# Patient Record
Sex: Female | Born: 1975 | Race: White | Hispanic: No | Marital: Married | Smoking: Former smoker
Health system: Southern US, Community
[De-identification: ages and names within clinical notes are randomized; demographics above are authoritative.]

## PROBLEM LIST (undated history)

## (undated) DIAGNOSIS — F32A Depression, unspecified: Secondary | ICD-10-CM

## (undated) DIAGNOSIS — T7840XA Allergy, unspecified, initial encounter: Secondary | ICD-10-CM

## (undated) DIAGNOSIS — F419 Anxiety disorder, unspecified: Secondary | ICD-10-CM

## (undated) DIAGNOSIS — K219 Gastro-esophageal reflux disease without esophagitis: Secondary | ICD-10-CM

## (undated) DIAGNOSIS — T8859XA Other complications of anesthesia, initial encounter: Secondary | ICD-10-CM

## (undated) DIAGNOSIS — F329 Major depressive disorder, single episode, unspecified: Secondary | ICD-10-CM

## (undated) DIAGNOSIS — R112 Nausea with vomiting, unspecified: Secondary | ICD-10-CM

## (undated) DIAGNOSIS — R87629 Unspecified abnormal cytological findings in specimens from vagina: Secondary | ICD-10-CM

## (undated) DIAGNOSIS — Z9889 Other specified postprocedural states: Secondary | ICD-10-CM

## (undated) DIAGNOSIS — T4145XA Adverse effect of unspecified anesthetic, initial encounter: Secondary | ICD-10-CM

## (undated) HISTORY — PX: TUBAL LIGATION: SHX77

## (undated) HISTORY — DX: Unspecified abnormal cytological findings in specimens from vagina: R87.629

## (undated) HISTORY — DX: Depression, unspecified: F32.A

## (undated) HISTORY — DX: Allergy, unspecified, initial encounter: T78.40XA

## (undated) HISTORY — PX: AXILLARY HIDRADENITIS EXCISION: SUR522

## (undated) HISTORY — DX: Gastro-esophageal reflux disease without esophagitis: K21.9

## (undated) HISTORY — DX: Anxiety disorder, unspecified: F41.9

## (undated) HISTORY — PX: APPENDECTOMY: SHX54

---
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
## 2015-04-14 ENCOUNTER — Encounter: Payer: Self-pay | Admitting: Emergency Medicine

## 2015-04-14 ENCOUNTER — Emergency Department
Admission: EM | Admit: 2015-04-14 | Discharge: 2015-04-14 | Disposition: A | Discharge status: Home / Self Care | Payer: Medicaid Other | Attending: Emergency Medicine | Admitting: Emergency Medicine

## 2015-04-14 DIAGNOSIS — Z3202 Encounter for pregnancy test, result negative: Secondary | ICD-10-CM | POA: Diagnosis not present

## 2015-04-14 DIAGNOSIS — Z87891 Personal history of nicotine dependence: Secondary | ICD-10-CM | POA: Diagnosis not present

## 2015-04-14 DIAGNOSIS — R112 Nausea with vomiting, unspecified: Secondary | ICD-10-CM | POA: Diagnosis present

## 2015-04-14 DIAGNOSIS — R197 Diarrhea, unspecified: Secondary | ICD-10-CM | POA: Insufficient documentation

## 2015-04-14 DIAGNOSIS — K625 Hemorrhage of anus and rectum: Secondary | ICD-10-CM

## 2015-04-14 LAB — COMPREHENSIVE METABOLIC PANEL
ALK PHOS: 52 U/L (ref 38–126)
ALT: 15 U/L (ref 14–54)
AST: 20 U/L (ref 15–41)
Albumin: 4.8 g/dL (ref 3.5–5.0)
Anion gap: 8 (ref 5–15)
BUN: 12 mg/dL (ref 6–20)
CHLORIDE: 106 mmol/L (ref 101–111)
CO2: 24 mmol/L (ref 22–32)
CREATININE: 0.53 mg/dL (ref 0.44–1.00)
Calcium: 9.4 mg/dL (ref 8.9–10.3)
GFR calc Af Amer: >60 mL/min (ref >60)
Glucose, Bld: 107 mg/dL — ABNORMAL HIGH (ref 65–99)
Potassium: 3.7 mmol/L (ref 3.5–5.1)
Sodium: 138 mmol/L (ref 135–145)
Total Bilirubin: 0.6 mg/dL (ref 0.3–1.2)
Total Protein: 8.2 g/dL — ABNORMAL HIGH (ref 6.5–8.1)

## 2015-04-14 LAB — CBC
HCT: 41.7 % (ref 35.0–47.0)
Hemoglobin: 13.9 g/dL (ref 12.0–16.0)
MCH: 31.7 pg (ref 26.0–34.0)
MCHC: 33.4 g/dL (ref 32.0–36.0)
MCV: 94.9 fL (ref 80.0–100.0)
PLATELETS: 272 10*3/uL (ref 150–440)
RBC: 4.39 MIL/uL (ref 3.80–5.20)
RDW: 12.8 % (ref 11.5–14.5)
WBC: 12.2 10*3/uL — AB (ref 3.6–11.0)

## 2015-04-14 LAB — URINALYSIS COMPLETE WITH MICROSCOPIC (ARMC ONLY)
BILIRUBIN URINE: NEGATIVE
Bacteria, UA: NONE SEEN
GLUCOSE, UA: NEGATIVE mg/dL
Hgb urine dipstick: NEGATIVE
Ketones, ur: NEGATIVE mg/dL
LEUKOCYTES UA: NEGATIVE
Nitrite: NEGATIVE
Protein, ur: 30 mg/dL — AB
Specific Gravity, Urine: 1.03 (ref 1.005–1.030)
pH: 6 (ref 5.0–8.0)

## 2015-04-14 LAB — LACTIC ACID, PLASMA: LACTIC ACID, VENOUS: 0.7 mmol/L (ref 0.5–2.0)

## 2015-04-14 LAB — POCT PREGNANCY, URINE: Preg Test, Ur: NEGATIVE

## 2015-04-14 LAB — LIPASE, BLOOD: LIPASE: 21 U/L (ref 11–51)

## 2015-04-14 MED ORDER — ONDANSETRON HCL 4 MG PO TABS: 4.0000 mg | ORAL_TABLET | Freq: Three times a day (TID) | ORAL | Status: DC | PRN

## 2015-04-14 NOTE — ED Provider Notes (Signed)
[facility] Emergency Department Provider Note   ____________________________________________  Time seen: I have reviewed the triage vital signs and the triage nursing note.  HISTORY  Chief Complaint Abdominal Pain and Rectal Bleeding   Historian Patient and friend  HPI Yolanda Curtis is a 40 y.o. female who is here for evaluation of multiple episodes of vomiting and diarrhea as well as bloody per rectum.Patient had [place] food last night and out of the many people who had dinner there, she is feeling when he got sick. No fever. She started feeling bad within about an hour and had numerous episodes of diarrhea as well as nausea with some vomiting. Overnight she had decrease in the amount of actual stool output, but about every one hour she had blood per rectum. No rectal pain. Mild abdominal cramping. No bloody emesis.    History reviewed. No pertinent past medical history. reports that 17 years ago she had an episode of bloody stool and had a CAT scan that showed "ischemic colitis"  She reports being treated with antibiotics, and then colonoscopies but no source of the ischemia was ever found. She does not report any abdominal pain at that point in time.  There are no active problems to display for this patient.   Past Surgical History  Procedure Laterality Date  . Cesarean section  x 2    No current outpatient prescriptions on file.  Allergies Review of patient's allergies indicates no known allergies.  No family history on file.  Social History Social History  Substance Use Topics  . Smoking status: Former Games developermoker  . Smokeless tobacco: Never Used  . Alcohol Use: 0.6 oz/week    1 Glasses of wine per week    Review of Systems  Constitutional: Negative for fever. Eyes: Negative for visual changes. ENT: Negative for sore throat. Cardiovascular: Negative for chest pain. Respiratory: Negative for shortness of breath. Gastrointestinal: As  per history of present illness Genitourinary: Negative for dysuria. Musculoskeletal: Negative for back pain. Skin: Negative for rash. Neurological: Negative for headache. 10 point Review of Systems otherwise negative ____________________________________________   PHYSICAL EXAM:  VITAL SIGNS: ED Triage Vitals  Enc Vitals Group     BP 04/14/15 0949 106/49 mmHg     Pulse Rate 04/14/15 0949 92     Resp 04/14/15 0949 16     Temp 04/14/15 0949 98.1 F (36.7 C)     Temp Source 04/14/15 0949 Oral     SpO2 04/14/15 0949 100 %     Weight 04/14/15 0949 130 lb (58.968 kg)     Height 04/14/15 0949 5\' 2"  (1.575 m)     Head Cir --      Peak Flow --      Pain Score 04/14/15 0954 2     Pain Loc --      Pain Edu? --      Excl. in GC? --      Constitutional: Alert and oriented. Well appearing and in no distress. Eyes: Conjunctivae are normal. PERRL. Normal extraocular movements. ENT   Head: Normocephalic and atraumatic.   Nose: No congestion/rhinnorhea.   Mouth/Throat: Mucous membranes are moist.   Neck: No stridor. Cardiovascular/Chest: Normal rate, regular rhythm.  No murmurs, rubs, or gallops. Respiratory: Normal respiratory effort without tachypnea nor retractions. Breath sounds are clear and equal bilaterally. No wheezes/rales/rhonchi. Gastrointestinal: Soft. No distention, no guarding, no rebound. Mild tenderness.  Genitourinary/rectal: External rectal exam normal. Nontender rectal exam. No blood on digital rectal exam. No  stool on digital rectal exam. Musculoskeletal: Nontender with normal range of motion in all extremities. No joint effusions.  No lower extremity tenderness.  No edema. Neurologic:  Normal speech and language. No gross or focal neurologic deficits are appreciated. Skin:  Skin is warm, dry and intact. No rash noted. Psychiatric: Mood and affect are normal. Speech and behavior are normal. Patient exhibits appropriate insight and  judgment.  ____________________________________________   EKG I, Governor Rooks, MD, the attending physician have personally viewed and interpreted all ECGs.  None ____________________________________________  LABS (pertinent positives/negatives)  Urine pregnancy test negative Lipase 21 Comprehensive metabolic panel without significant abnormality White blood count 12.2, hemoglobin 13.9 and platelet count 272 Urinalysis negative for ketones and otherwise negative Lactate 0.7  ____________________________________________  RADIOLOGY All Xrays were viewed by me. Imaging interpreted by Radiologist.  None __________________________________________  PROCEDURES  Procedure(s) performed: None  Critical Care performed: None  ____________________________________________   ED COURSE / ASSESSMENT AND PLAN  CONSULTATIONS: None  Pertinent labs & imaging results that were available during my care of the patient were reviewed by me and considered in my medical decision making (see chart for details).   It seems like her symptoms may have been related to that food yesterday. She reports that her symptoms are mostly resolved at this point in time. We discussed that her exam, vital signs, and laboratory evaluation are reassuring. Her white blood count is minimally elevated, but her hemoglobin is reassuring at 13.9.  Given that she is not having any additional diarrhea or bloody bowel movements, I think it is okay for her to eat discharged. We discussed that I would recommend stool culture if she has additional or recurrent/persistent bloody diarrhea.  When she was about to be discharged, she given a history that when she was younger she had an episode that was diagnosed as ischemic colitis. This seems quite unusual especially given her age, however she seems to remember quite clearly that was called "ischemic" and that no certain cause was found for that.  With her abdomen soft and  nontender, I'm not inclined to do a CT scan today. I discussed with her that I will add on a lactate in terms of the way to evaluate for ischemic colitis.  Lactate normal.  ----------------------------------------- 3:22 PM on 04/14/2015 -----------------------------------------  No additional blood per rectum or bowel movements here in the ED. Patient feels well or just some very very mild cramping or queasy feeling. She is trying to drive home to [place] either tonight or tomorrow. I will discharge her with a couple of Zofran.  Patient / Family / Caregiver informed of clinical course, medical decision-making process, and agree with plan.   I discussed return precautions, follow-up instructions, and discharged instructions with patient and/or family.  ___________________________________________   FINAL CLINICAL IMPRESSION(S) / ED DIAGNOSES   Final diagnoses:  Nausea vomiting and diarrhea  Blood per rectum              Note: This dictation was prepared with Dragon dictation. Any transcriptional errors that result from this process are unintentional   Governor Rooks, MD 04/14/15 1523

## 2015-04-14 NOTE — ED Notes (Signed)
Pt states she went out to eat yesterday then 30 mins later multiple episodes diarrhea and 2 episodes of vomiting. She reports since about 9pm she had no stool but mucus-like blood from her stool. Also lower abd cramping denies fever.

## 2015-04-14 NOTE — Discharge Instructions (Signed)
You were evaluated for nausea vomiting and diarrhea with blood per rectum, and your exam and evaluation are reassuring today in the emergency department.  Return to the emergency department for any worsening condition including worsening abdominal pain, fever, concern for dehydration, additional bleeding with dizziness, passing out, or any other symptoms concerning to you.  We discussed if you had fever or certainly continued diarrhea that was bloody that I would recommend a stool culture.   Bloody Diarrhea Bloody diarrhea can be caused by many different conditions. Most of the time bloody diarrhea is the result of food poisoning or minor infections. Bloody diarrhea usually improves over 2 to 3 days of rest and fluid replacement. Other conditions that can cause bloody diarrhea include:  Internal bleeding.  Infection.  Diseases of the bowel and colon. Internal bleeding from an ulcer or bowel disease can be severe and requires hospital care or even surgery. DIAGNOSIS  To find out what is wrong your caregiver may check your:  Stool.  Blood.  Results from a test that looks inside the body (endoscopy). TREATMENT   Get plenty of rest.  Drink enough water and fluids to keep your urine clear or pale yellow.  Do not smoke.  Solid foods and dairy products should be avoided until your illness improves.  As you improve, slowly return to a regular diet with easily-digested foods first. Examples are:  Bananas.  Rice.  Toast.  Crackers. You should only need these for about 2 days before adding more normal foods to your diet.  Avoid spicy or fatty foods as well as caffeine and alcohol for several days.  Medicine to control cramping and diarrhea can relieve symptoms but may prolong some cases of bloody diarrhea. Antibiotics can speed recovery from diarrhea due to some bacterial infections. Call your caregiver if diarrhea does not get better in 3 days. SEEK MEDICAL CARE IF:   You do  not improve after 3 days.  Your diarrhea improves but your stool appears black. SEEK IMMEDIATE MEDICAL CARE IF:   You become extremely weak or faint.  You become very sweaty.  You have increased pain or bleeding.  You develop repeated vomiting.  You vomit and you see blood or the vomit looks black in color.  You have a fever.   This information is not intended to replace advice given to you by your health care provider. Make sure you discuss any questions you have with your health care provider.   Document Released: 03/16/2005 Document Revised: 04/06/2014 Document Reviewed: 02/15/2009 Elsevier Interactive Patient Education [employer]2016 Elsevier Inc.

## 2018-08-23 ENCOUNTER — Other Ambulatory Visit: Payer: Self-pay

## 2018-08-23 ENCOUNTER — Encounter: Payer: Self-pay | Admitting: Radiology

## 2018-08-23 ENCOUNTER — Encounter: Payer: Self-pay | Admitting: Family Medicine

## 2018-08-23 ENCOUNTER — Ambulatory Visit (INDEPENDENT_AMBULATORY_CARE_PROVIDER_SITE_OTHER): Payer: 59 | Admitting: Family Medicine

## 2018-08-23 VITALS — BP 133/72 | HR 87 | Ht 62.0 in | Wt 149.0 lb

## 2018-08-23 DIAGNOSIS — F3281 Premenstrual dysphoric disorder: Secondary | ICD-10-CM

## 2018-08-23 DIAGNOSIS — N390 Urinary tract infection, site not specified: Secondary | ICD-10-CM

## 2018-08-23 DIAGNOSIS — B9689 Other specified bacterial agents as the cause of diseases classified elsewhere: Secondary | ICD-10-CM | POA: Insufficient documentation

## 2018-08-23 DIAGNOSIS — N760 Acute vaginitis: Secondary | ICD-10-CM

## 2018-08-23 DIAGNOSIS — Z124 Encounter for screening for malignant neoplasm of cervix: Secondary | ICD-10-CM

## 2018-08-23 DIAGNOSIS — A609 Anogenital herpesviral infection, unspecified: Secondary | ICD-10-CM

## 2018-08-23 DIAGNOSIS — Z01411 Encounter for gynecological examination (general) (routine) with abnormal findings: Secondary | ICD-10-CM | POA: Diagnosis not present

## 2018-08-23 DIAGNOSIS — Z1151 Encounter for screening for human papillomavirus (HPV): Secondary | ICD-10-CM | POA: Diagnosis not present

## 2018-08-23 MED ORDER — NORETHIN-ETH ESTRAD-FE BIPHAS 1 MG-10 MCG / 10 MCG PO TABS: 1.0000 | ORAL_TABLET | Freq: Every day | ORAL | 3 refills | Status: DC

## 2018-08-23 NOTE — Assessment & Plan Note (Signed)
Trial of low dose OCP--has been on and tolerated in the past, since SSRI did not help in the past.

## 2018-08-23 NOTE — Progress Notes (Signed)
  Subjective:     Yolanda Curtis is a 43 y.o. female and is here for a comprehensive physical exam. The patient reports problems - some hormonal issues.. Reports frequent UTIs and recurrent BV. Notes a lot of symptoms related to her cycles. She notes, poor mood, breast tenderness, weight gain, cycles are monthly, not too heavy. Has been on Serafem, but it wasn't helping. She also has cyclical low back pain.  The following portions of the patient's history were reviewed and updated as appropriate: allergies, current medications, past family history, past medical history, past social history, past surgical history and problem list.  Review of Systems Pertinent items noted in HPI and remainder of comprehensive ROS otherwise negative.   Objective:    BP 133/72   Pulse 87   Ht 5\' 2"  (1.575 m)   Wt 149 lb (67.6 kg)   LMP 08/07/2018 (Exact Date)   BMI 27.25 kg/m  General appearance: alert, cooperative and appears stated age Head: Normocephalic, without obvious abnormality, atraumatic Neck: no adenopathy, supple, symmetrical, trachea midline and thyroid not enlarged, symmetric, no tenderness/mass/nodules Lungs: clear to auscultation bilaterally Breasts: normal appearance, no masses or tenderness Heart: regular rate and rhythm, S1, S2 normal, no murmur, click, rub or gallop Abdomen: soft, non-tender; bowel sounds normal; no masses,  no organomegaly Pelvic: cervix normal in appearance, external genitalia normal, no adnexal masses or tenderness, no cervical motion tenderness, uterus normal size, shape, and consistency and vagina normal without discharge Extremities: extremities normal, atraumatic, no cyanosis or edema Pulses: 2+ and symmetric Skin: Skin color, texture, turgor normal. No rashes or lesions Lymph nodes: Cervical, supraclavicular, and axillary nodes normal.    Assessment:    Healthy female exam.      Plan:   Problem List Items Addressed This Visit      Unprioritized   Frequent UTI   Relevant Medications   nitrofurantoin, macrocrystal-monohydrate, (MACROBID) 100 MG capsule   Bacterial vaginosis   HSV (herpes simplex virus) anogenital infection   PMDD (premenstrual dysphoric disorder) - Primary    Trial of low dose OCP--has been on and tolerated in the past, since SSRI did not help in the past.      Relevant Medications   Norethindrone-Ethinyl Estradiol-Fe Biphas (LO LOESTRIN FE) 1 MG-10 MCG / 10 MCG tablet    Other Visit Diagnoses    Screening for malignant neoplasm of cervix       Relevant Orders   Cytology - PAP( [facility])   Encounter for gynecological examination with abnormal finding       Relevant Orders   MM DIGITAL SCREENING BILATERAL   CBC   Comprehensive metabolic panel   Hemoglobin A1c   Lipid panel   TSH   Vitamin D (25 hydroxy)     Return in 1 year (on 08/23/2019).    See After Visit Summary for Counseling Recommendations

## 2018-08-23 NOTE — Patient Instructions (Signed)
 Preventive Care 18-39 Years, Female Preventive care refers to lifestyle choices and visits with your health care provider that can promote health and wellness. What does preventive care include?   A yearly physical exam. This is also called an annual well check.  Dental exams once or twice a year.  Routine eye exams. Ask your health care provider how often you should have your eyes checked.  Personal lifestyle choices, including: ? Daily care of your teeth and gums. ? Regular physical activity. ? Eating a healthy diet. ? Avoiding tobacco and drug use. ? Limiting alcohol use. ? Practicing safe sex. ? Taking vitamin and mineral supplements as recommended by your health care provider. What happens during an annual well check? The services and screenings done by your health care provider during your annual well check will depend on your age, overall health, lifestyle risk factors, and family history of disease. Counseling Your health care provider may ask you questions about your:  Alcohol use.  Tobacco use.  Drug use.  Emotional well-being.  Home and relationship well-being.  Sexual activity.  Eating habits.  Work and work environment.  Method of birth control.  Menstrual cycle.  Pregnancy history. Screening You may have the following tests or measurements:  Height, weight, and BMI.  Diabetes screening. This is done by checking your blood sugar (glucose) after you have not eaten for a while (fasting).  Blood pressure.  Lipid and cholesterol levels. These may be checked every 5 years starting at age 20.  Skin check.  Hepatitis C blood test.  Hepatitis B blood test.  Sexually transmitted disease (STD) testing.  BRCA-related cancer screening. This may be done if you have a family history of breast, ovarian, tubal, or peritoneal cancers.  Pelvic exam and Pap test. This may be done every 3 years starting at age 21. Starting at age 30, this may be done  every 5 years if you have a Pap test in combination with an HPV test. Discuss your test results, treatment options, and if necessary, the need for more tests with your health care provider. Vaccines Your health care provider may recommend certain vaccines, such as:  Influenza vaccine. This is recommended every year.  Tetanus, diphtheria, and acellular pertussis (Tdap, Td) vaccine. You may need a Td booster every 10 years.  Varicella vaccine. You may need this if you have not been vaccinated.  HPV vaccine. If you are 26 or younger, you may need three doses over 6 months.  Measles, mumps, and rubella (MMR) vaccine. You may need at least one dose of MMR. You may also need a second dose.  Pneumococcal 13-valent conjugate (PCV13) vaccine. You may need this if you have certain conditions and were not previously vaccinated.  Pneumococcal polysaccharide (PPSV23) vaccine. You may need one or two doses if you smoke cigarettes or if you have certain conditions.  Meningococcal vaccine. One dose is recommended if you are age 19-21 years and a first-year college student living in a residence hall, or if you have one of several medical conditions. You may also need additional booster doses.  Hepatitis A vaccine. You may need this if you have certain conditions or if you travel or work in places where you may be exposed to hepatitis A.  Hepatitis B vaccine. You may need this if you have certain conditions or if you travel or work in places where you may be exposed to hepatitis B.  Haemophilus influenzae type b (Hib) vaccine. You may need this if   you have certain risk factors. Talk to your health care provider about which screenings and vaccines you need and how often you need them. This information is not intended to replace advice given to you by your health care provider. Make sure you discuss any questions you have with your health care provider. Document Released: 05/12/2001 Document Revised:  10/27/2016 Document Reviewed: 01/15/2015 Elsevier Interactive Patient Education  2019 Elsevier Inc.  

## 2018-08-23 NOTE — Progress Notes (Signed)
Had abnormal pap in 11/2017, was told she needed a repeat pap in 6 months. Moved from [place] in March.  Would like discuss symptoms she has around her cycle.     Mammo was done 11/2017-WNL

## 2018-08-25 LAB — CYTOLOGY - PAP
Diagnosis: NEGATIVE
HPV: NOT DETECTED

## 2018-11-03 ENCOUNTER — Telehealth: Payer: Self-pay

## 2018-11-03 NOTE — Telephone Encounter (Signed)
Patient was started on Northindrone the lowest dose. She is almost 2-3 month since she starting taking these pills. She reports her symptoms are little better. She is having some breakthrough bleeding right before her cycle. She would like to know if you want to increase the dose or should she schedule a follow up appointment to discuss?

## 2018-11-03 NOTE — Telephone Encounter (Signed)
Patient was started on lo

## 2018-11-08 ENCOUNTER — Telehealth (INDEPENDENT_AMBULATORY_CARE_PROVIDER_SITE_OTHER): Payer: 59 | Admitting: Family Medicine

## 2018-11-08 ENCOUNTER — Other Ambulatory Visit: Payer: Self-pay

## 2018-11-08 ENCOUNTER — Telehealth: Payer: Self-pay | Admitting: Radiology

## 2018-11-08 ENCOUNTER — Encounter: Payer: Self-pay | Admitting: Family Medicine

## 2018-11-08 DIAGNOSIS — F3281 Premenstrual dysphoric disorder: Secondary | ICD-10-CM | POA: Diagnosis not present

## 2018-11-08 MED ORDER — NORETHIN ACE-ETH ESTRAD-FE 1-20 MG-MCG(24) PO TABS: 1.0000 | ORAL_TABLET | Freq: Every day | ORAL | 11 refills | Status: DC

## 2018-11-08 NOTE — Progress Notes (Signed)
    TELEHEALTH GYNECOLOGY VIRTUAL VIDEO VISIT ENCOUNTER NOTE  Provider location: [facility] at [facility]   I connected with Yolanda Curtis on 11/08/18 at  2:15 PM EDT by MyChart Video Encounter at home and verified that I am speaking with the correct person using two identifiers.   I discussed the limitations, risks, security and privacy concerns of performing an evaluation and management service virtually and the availability of in person appointments. I also discussed with the patient that there may be a patient responsible charge related to this service. The patient expressed understanding and agreed to proceed.   History:  Yolanda Curtis is a 43 y.o. G17P2002 female being evaluated today for f/u OC start. Placed on Lo loestrin but still having her low back pain and mood is somewhat improved but not completely better. Also with some breakthrough bleeding at end of pack. She had been reluctant to get on estrogen at her age and so we started with a super low dose pill. She desires to increase the dosage to see if we can manage her symptoms better.She denies any abnormal vaginal discharge, pelvic pain or other concerns.       Past Medical History:  Diagnosis Date  . Vaginal Pap smear, abnormal    Past Surgical History:  Procedure Laterality Date  . AXILLARY HIDRADENITIS EXCISION    . CESAREAN SECTION  x 2  . TUBAL LIGATION     The following portions of the patient's history were reviewed and updated as appropriate: allergies, current medications, past family history, past medical history, past social history, past surgical history and problem list.   Health Maintenance:  Normal pap and negative HRHPV on 07/2018.   Review of Systems:  Pertinent items noted in HPI and remainder of comprehensive ROS otherwise negative.  Physical Exam:   General:  Alert, oriented and cooperative. Patient appears to be in no acute distress.  Mental Status: Normal mood and affect.  Normal behavior. Normal judgment and thought content.   Respiratory: Normal respiratory effort, no problems with respiration noted  Rest of physical exam deferred due to type of encounter  Labs and Imaging No results found for this or any previous visit (from the past 336 hour(s)). No results found.     Assessment and Plan:     Problem List Items Addressed This Visit      Unprioritized   PMDD (premenstrual dysphoric disorder) - Primary    Switch to Loestrin x 3 months. She is invited to message me if not seeing th improvement she desires or if she is having more breakthrough bleeding.      Relevant Medications   Norethindrone Acetate-Ethinyl Estrad-FE (LOESTRIN 24 FE) 1-20 MG-MCG(24) tablet     I discussed the assessment and treatment plan with the patient. The patient was provided an opportunity to ask questions and all were answered. The patient agreed with the plan and demonstrated an understanding of the instructions.   The patient was advised to call back or seek an in-person evaluation/go to the ED if the symptoms worsen or if the condition fails to improve as anticipated.  I provided 15 minutes of face-to-face time during this encounter.  Return in about 3 months (around 02/08/2019) for virtual, a follow-up.   Donnamae Jude, MD [facility], [place]

## 2018-11-08 NOTE — Assessment & Plan Note (Signed)
Switch to Loestrin x 3 months. She is invited to message me if not seeing th improvement she desires or if she is having more breakthrough bleeding.

## 2018-11-08 NOTE — Progress Notes (Signed)
Last cycle 10/24/2018 Last pap 08/23/2018- normal

## 2018-11-08 NOTE — Telephone Encounter (Signed)
Left message for patient to call [id number] to schedule virtual visit with Dr Kennon Rounds for [facility] concern

## 2018-11-14 ENCOUNTER — Telehealth: Payer: 59 | Admitting: Family Medicine

## 2019-02-08 ENCOUNTER — Ambulatory Visit
Admission: RE | Admit: 2019-02-08 | Discharge: 2019-02-08 | Disposition: A | Discharge status: Home / Self Care | Payer: 59 | Source: Ambulatory Visit | Attending: Family Medicine | Admitting: Family Medicine

## 2019-02-08 DIAGNOSIS — Z1231 Encounter for screening mammogram for malignant neoplasm of breast: Secondary | ICD-10-CM | POA: Insufficient documentation

## 2019-02-08 DIAGNOSIS — Z01411 Encounter for gynecological examination (general) (routine) with abnormal findings: Secondary | ICD-10-CM

## 2019-02-28 ENCOUNTER — Other Ambulatory Visit: Payer: Self-pay | Admitting: Family Medicine

## 2019-02-28 DIAGNOSIS — R928 Other abnormal and inconclusive findings on diagnostic imaging of breast: Secondary | ICD-10-CM

## 2019-02-28 DIAGNOSIS — R921 Mammographic calcification found on diagnostic imaging of breast: Secondary | ICD-10-CM

## 2019-03-06 ENCOUNTER — Ambulatory Visit
Admission: RE | Admit: 2019-03-06 | Discharge: 2019-03-06 | Disposition: A | Discharge status: Home / Self Care | Payer: 59 | Source: Ambulatory Visit | Attending: Family Medicine | Admitting: Family Medicine

## 2019-03-06 DIAGNOSIS — R921 Mammographic calcification found on diagnostic imaging of breast: Secondary | ICD-10-CM | POA: Diagnosis present

## 2019-03-06 DIAGNOSIS — R928 Other abnormal and inconclusive findings on diagnostic imaging of breast: Secondary | ICD-10-CM | POA: Insufficient documentation

## 2019-04-21 ENCOUNTER — Encounter: Payer: Self-pay | Admitting: Adult Health

## 2019-04-21 ENCOUNTER — Other Ambulatory Visit: Payer: Self-pay

## 2019-04-21 ENCOUNTER — Ambulatory Visit (INDEPENDENT_AMBULATORY_CARE_PROVIDER_SITE_OTHER): Payer: 59 | Admitting: Adult Health

## 2019-04-21 VITALS — BP 122/84 | HR 88 | Temp 97.0°F | Resp 16 | Ht 61.5 in | Wt 149.0 lb

## 2019-04-21 DIAGNOSIS — Z1389 Encounter for screening for other disorder: Secondary | ICD-10-CM

## 2019-04-21 DIAGNOSIS — K2950 Unspecified chronic gastritis without bleeding: Secondary | ICD-10-CM

## 2019-04-21 DIAGNOSIS — Z1329 Encounter for screening for other suspected endocrine disorder: Secondary | ICD-10-CM

## 2019-04-21 DIAGNOSIS — R5383 Other fatigue: Secondary | ICD-10-CM | POA: Diagnosis not present

## 2019-04-21 DIAGNOSIS — Z13220 Encounter for screening for lipoid disorders: Secondary | ICD-10-CM

## 2019-04-21 DIAGNOSIS — R632 Polyphagia: Secondary | ICD-10-CM

## 2019-04-21 DIAGNOSIS — F411 Generalized anxiety disorder: Secondary | ICD-10-CM

## 2019-04-21 DIAGNOSIS — E559 Vitamin D deficiency, unspecified: Secondary | ICD-10-CM

## 2019-04-21 DIAGNOSIS — Z8719 Personal history of other diseases of the digestive system: Secondary | ICD-10-CM

## 2019-04-21 DIAGNOSIS — F32A Depression, unspecified: Secondary | ICD-10-CM

## 2019-04-21 DIAGNOSIS — F329 Major depressive disorder, single episode, unspecified: Secondary | ICD-10-CM

## 2019-04-21 DIAGNOSIS — F4010 Social phobia, unspecified: Secondary | ICD-10-CM

## 2019-04-21 DIAGNOSIS — Z0000 Encounter for general adult medical examination without abnormal findings: Secondary | ICD-10-CM

## 2019-04-21 HISTORY — DX: Unspecified chronic gastritis without bleeding: K29.50

## 2019-04-21 LAB — POCT URINALYSIS DIPSTICK
Bilirubin, UA: NEGATIVE
Blood, UA: NEGATIVE
Glucose, UA: NEGATIVE
Ketones, UA: NEGATIVE
Leukocytes, UA: NEGATIVE
Nitrite, UA: NEGATIVE
Protein, UA: NEGATIVE
Spec Grav, UA: 1.015 (ref 1.010–1.025)
Urobilinogen, UA: 0.2 E.U./dL
pH, UA: 6 (ref 5.0–8.0)

## 2019-04-21 MED ORDER — VENLAFAXINE HCL ER 37.5 MG PO CP24: 37.5000 mg | ORAL_CAPSULE | Freq: Every day | ORAL | 0 refills | Status: DC

## 2019-04-21 NOTE — Patient Instructions (Addendum)
Health Maintenance, Female Adopting a healthy lifestyle and getting preventive care are important in promoting health and wellness. Ask your health care provider about:  The right schedule for you to have regular tests and exams.  Things you can do on your own to prevent diseases and keep yourself healthy. What should I know about diet, weight, and exercise? Eat a healthy diet   Eat a diet that includes plenty of vegetables, fruits, low-fat dairy products, and lean protein.  Do not eat a lot of foods that are high in solid fats, added sugars, or sodium. Maintain a healthy weight Body mass index (BMI) is used to identify weight problems. It estimates body fat based on height and weight. Your health care provider can help determine your BMI and help you achieve or maintain a healthy weight. Get regular exercise Get regular exercise. This is one of the most important things you can do for your health. Most adults should:  Exercise for at least 150 minutes each week. The exercise should increase your heart rate and make you sweat (moderate-intensity exercise).  Do strengthening exercises at least twice a week. This is in addition to the moderate-intensity exercise.  Spend less time sitting. Even light physical activity can be beneficial. Watch cholesterol and blood lipids Have your blood tested for lipids and cholesterol at 42 years of age, then have this test every 5 years. Have your cholesterol levels checked more often if:  Your lipid or cholesterol levels are high.  You are older than 42 years of age.  You are at high risk for heart disease. What should I know about cancer screening? Depending on your health history and family history, you may need to have cancer screening at various ages. This may include screening for:  Breast cancer.  Cervical cancer.  Colorectal cancer.  Skin cancer.  Lung cancer. What should I know about heart disease, diabetes, and high blood  pressure? Blood pressure and heart disease  High blood pressure causes heart disease and increases the risk of stroke. This is more likely to develop in people who have high blood pressure readings, are of African descent, or are overweight.  Have your blood pressure checked: ? Every 3-5 years if you are 18-39 years of age. ? Every year if you are 40 years old or older. Diabetes Have regular diabetes screenings. This checks your fasting blood sugar level. Have the screening done:  Once every three years after age 40 if you are at a normal weight and have a low risk for diabetes.  More often and at a younger age if you are overweight or have a high risk for diabetes. What should I know about preventing infection? Hepatitis B If you have a higher risk for hepatitis B, you should be screened for this virus. Talk with your health care provider to find out if you are at risk for hepatitis B infection. Hepatitis C Testing is recommended for:  Everyone born from 1945 through 1965.  Anyone with known risk factors for hepatitis C. Sexually transmitted infections (STIs)  Get screened for STIs, including gonorrhea and chlamydia, if: ? You are sexually active and are younger than 42 years of age. ? You are older than 42 years of age and your health care provider tells you that you are at risk for this type of infection. ? Your sexual activity has changed since you were last screened, and you are at increased risk for chlamydia or gonorrhea. Ask your health care provider if   you are at risk.  Ask your health care provider about whether you are at high risk for HIV. Your health care provider may recommend a prescription medicine to help prevent HIV infection. If you choose to take medicine to prevent HIV, you should first get tested for HIV. You should then be tested every 3 months for as long as you are taking the medicine. Pregnancy  If you are about to stop having your period (premenopausal) and  you may become pregnant, seek counseling before you get pregnant.  Take 400 to 800 micrograms (mcg) of folic acid every day if you become pregnant.  Ask for birth control (contraception) if you want to prevent pregnancy. Osteoporosis and menopause Osteoporosis is a disease in which the bones lose minerals and strength with aging. This can result in bone fractures. If you are 80 years old or older, or if you are at risk for osteoporosis and fractures, ask your health care provider if you should:  Be screened for bone loss.  Take a calcium or vitamin D supplement to lower your risk of fractures.  Be given hormone replacement therapy (HRT) to treat symptoms of menopause. Follow these instructions at home: Lifestyle  Do not use any products that contain nicotine or tobacco, such as cigarettes, e-cigarettes, and chewing tobacco. If you need help quitting, ask your health care provider.  Do not use street drugs.  Do not share needles.  Ask your health care provider for help if you need support or information about quitting drugs. Alcohol use  Do not drink alcohol if: ? Your health care provider tells you not to drink. ? You are pregnant, may be pregnant, or are planning to become pregnant.  If you drink alcohol: ? Limit how much you use to 0-1 drink a day. ? Limit intake if you are breastfeeding.  Be aware of how much alcohol is in your drink. In the [place] one drink equals one 12 oz bottle of beer (355 mL), one 5 oz glass of wine (148 mL), or one 1 oz glass of hard liquor (44 mL). General instructions  Schedule regular health, dental, and eye exams.  Stay current with your vaccines.  Tell your health care provider if: ? You often feel depressed. ? You have ever been abused or do not feel safe at home. Summary  Adopting a healthy lifestyle and getting preventive care are important in promoting health and wellness.  Follow your health care provider's instructions about healthy  diet, exercising, and getting tested or screened for diseases.  Follow your health care provider's instructions on monitoring your cholesterol and blood pressure. This information is not intended to replace advice given to you by your health care provider. Make sure you discuss any questions you have with your health care provider. Document Revised: 03/09/2018 Document Reviewed: 03/09/2018 Elsevier Patient Education  [place], Adult After being diagnosed with an anxiety disorder, you may be relieved to know why you have felt or behaved a certain way. You may also feel overwhelmed about the treatment ahead and what it will mean for your life. With care and support, you can manage this condition and recover from it. How to manage lifestyle changes Managing stress and anxiety  Stress is your body's reaction to life changes and events, both good and bad. Most stress will last just a few hours, but stress can be ongoing and can lead to more than just stress. Although stress can play a major role in anxiety,  it is not the same as anxiety. Stress is usually caused by something external, such as a deadline, test, or competition. Stress normally passes after the triggering event has ended.  Anxiety is caused by something internal, such as imagining a terrible outcome or worrying that something will go wrong that will devastate you. Anxiety often does not go away even after the triggering event is over, and it can become long-term (chronic) worry. It is important to understand the differences between stress and anxiety and to manage your stress effectively so that it does not lead to an anxious response. Talk with your health care provider or a counselor to learn more about reducing anxiety and stress. He or she may suggest tension reduction techniques, such as:  Music therapy. This can include creating or listening to music that you enjoy and that inspires you.  Mindfulness-based  meditation. This involves being aware of your normal breaths while not trying to control your breathing. It can be done while sitting or walking.  Centering prayer. This involves focusing on a word, phrase, or sacred image that means something to you and brings you peace.  Deep breathing. To do this, expand your stomach and inhale slowly through your nose. Hold your breath for 3-5 seconds. Then exhale slowly, letting your stomach muscles relax.  Self-talk. This involves identifying thought patterns that lead to anxiety reactions and changing those patterns.  Muscle relaxation. This involves tensing muscles and then relaxing them. Choose a tension reduction technique that suits your lifestyle and personality. These techniques take time and practice. Set aside 5-15 minutes a day to do them. Therapists can offer counseling and training in these techniques. The training to help with anxiety may be covered by some insurance plans. Other things you can do to manage stress and anxiety include:  Keeping a stress/anxiety diary. This can help you learn what triggers your reaction and then learn ways to manage your response.  Thinking about how you react to certain situations. You may not be able to control everything, but you can control your response.  Making time for activities that help you relax and not feeling guilty about spending your time in this way.  Visual imagery and yoga can help you stay calm and relax.  Medicines Medicines can help ease symptoms. Medicines for anxiety include:  Anti-anxiety drugs.  Antidepressants. Medicines are often used as a primary treatment for anxiety disorder. Medicines will be prescribed by a health care provider. When used together, medicines, psychotherapy, and tension reduction techniques may be the most effective treatment. Relationships Relationships can play a big part in helping you recover. Try to spend more time connecting with trusted friends and  family members. Consider going to couples counseling, taking family education classes, or going to family therapy. Therapy can help you and others better understand your condition. How to recognize changes in your anxiety Everyone responds differently to treatment for anxiety. Recovery from anxiety happens when symptoms decrease and stop interfering with your daily activities at home or work. This may mean that you will start to:  Have better concentration and focus. Worry will interfere less in your daily thinking.  Sleep better.  Be less irritable.  Have more energy.  Have improved memory. It is important to recognize when your condition is getting worse. Contact your health care provider if your symptoms interfere with home or work and you feel like your condition is not improving. Follow these instructions at home: Activity  Exercise. Most adults should do  the following: ? Exercise for at least 150 minutes each week. The exercise should increase your heart rate and make you sweat (moderate-intensity exercise). ? Strengthening exercises at least twice a week.  Get the right amount and quality of sleep. Most adults need 7-9 hours of sleep each night. Lifestyle   Eat a healthy diet that includes plenty of vegetables, fruits, whole grains, low-fat dairy products, and lean protein. Do not eat a lot of foods that are high in solid fats, added sugars, or salt.  Make choices that simplify your life.  Do not use any products that contain nicotine or tobacco, such as cigarettes, e-cigarettes, and chewing tobacco. If you need help quitting, ask your health care provider.  Avoid caffeine, alcohol, and certain over-the-counter cold medicines. These may make you feel worse. Ask your pharmacist which medicines to avoid. General instructions  Take over-the-counter and prescription medicines only as told by your health care provider.  Keep all follow-up visits as told by your health care  provider. This is important. Where to find support You can get help and support from these sources:  Self-help groups.  Online and [employer] organizations.  A trusted spiritual leader.  Couples counseling.  Family education classes.  Family therapy. Where to find more information You may find that joining a support group helps you deal with your anxiety. The following sources can help you locate counselors or support groups near you:  Mental Health America: www.mentalhealthamerica.net  Anxiety and Depression Association of [place] (ADAA): ProgramCam.dewww.adaa.org  The [employer] Alliance on Mental Illness (NAMI): www.nami.org Contact a health care provider if you:  Have a hard time staying focused or finishing daily tasks.  Spend many hours a day feeling worried about everyday life.  Become exhausted by worry.  Start to have headaches, feel tense, or have nausea.  Urinate more than normal.  Have diarrhea. Get help right away if you have:  A racing heart and shortness of breath.  Thoughts of hurting yourself or others. If you ever feel like you may hurt yourself or others, or have thoughts about taking your own life, get help right away. You can go to your nearest emergency department or call:  Your local emergency services (911 in the [place]  A suicide crisis helpline, such as the National Suicide Prevention Lifeline at 50839834631-581 702 0003. This is open 24 hours a day. Summary  Taking steps to learn and use tension reduction techniques can help calm you and help prevent triggering an anxiety reaction.  When used together, medicines, psychotherapy, and tension reduction techniques may be the most effective treatment.  Family, friends, and partners can play a big part in helping you recover from an anxiety disorder. This information is not intended to replace advice given to you by your health care provider. Make sure you discuss any questions you have with your health care  provider. Document Revised: 08/16/2018 Document Reviewed: 08/16/2018 Elsevier Patient Education  2020 [employer] Social Anxiety Disorder, Adult Social anxiety disorder (SAD), previously called social phobia, is a mental health condition. People with SAD often feel nervous, afraid, or embarrassed when they are around other people in social situations. They worry that other people are judging or criticizing them for how they look, what they say, or how they act. SAD involves more than just feeling shy or self-conscious at times. It can cause severe emotional distress. It can interfere with activities of daily life. SAD also may lead to alcohol or drug use, and even suicide. SAD is a common  mental health condition. It can develop at any time, but it usually starts in the teenage years. What are the causes? The cause of this condition is not known. It may involve genes that are passed through families. Stressful events may trigger anxiety. This disorder is also associated with an overactive amygdala. The amygdala is the part of the brain that triggers your response to strong feelings, such as fear. What increases the risk? This condition is more likely to develop in:  People who have a family history of anxiety disorders.  Women.  People who have a physical or behavioral condition that makes them feel self-conscious or nervous, such as a stutter or a long-term (chronic) disease. What are the signs or symptoms? The main symptom of this condition is fear of embarrassment caused by being criticized or judged in social situations. You may be afraid to:  Speak in public.  Go shopping.  Use a public bathroom.  Eat at a restaurant.  Go to work.  Interact with people you do not know. Extreme fear and anxiety may cause physical symptoms, including:  Blushing.  A fast heartbeat.  Sweating.  Shaky hands or voice.  Confusion.  Light-headedness.  Upset stomach, diarrhea, or  vomiting.  Shortness of breath. How is this diagnosed? This condition is diagnosed based on your history, symptoms, and behavior in social situations. You may be diagnosed with this type of anxiety if your symptoms have lasted for more than 6 months and have been present on more days than not. Your health care provider may ask you about your use of alcohol, drugs, and prescription medicines. He or she may also refer you to a mental health specialist for further evaluation or treatment. How is this treated? Treatment for this condition may include:  Cognitive behavioral therapy (CBT). This type of talk therapy helps you learn to replace negative thoughts and behaviors with positive ones. This may include learning how to use self-calming skills and other methods of managing your anxiety.  Exposure therapy. You will be exposed to social situations that cause you fear. The treatment starts with practicing self-calming in situations that cause you low levels of fear. Over time, you will progress by sustaining self-calming and managing harder situations.  Antidepressant medicines. These medicines may be used by themselves or in addition to other therapies.  Biofeedback. This process trains you to manage your body's response (physiological response) through breathing techniques and relaxation methods. You will work with a therapist while machines are used to monitor your physical symptoms.  Techniques for relaxation and managing anxiety. These include deep breathing, self-talk, meditation, visual imagery, muscle relaxation, music therapy, and yoga. These techniques are often used with other therapies to keep you calm in situations that cause you anxiety. These treatments are often used in combination. Follow these instructions at home: Alcohol use If you drink alcohol:  Limit how much you use to: ? 0-1 drink a day for nonpregnant women. ? 0-2 drinks a day for men.  Be aware of how much alcohol is  in your drink. In the [place] one drink equals one 12 oz bottle of beer (355 mL), one 5 oz glass of wine (148 mL), or one 1 oz glass of hard liquor (44 mL). General instructions  Take over-the-counter and prescription medicines only as told by your health care provider.  Practice techniques for relaxation and managing anxiety at times you are not challenged by social anxiety.  Return to social activities using techniques you have learned, as  you feel ready to do so.  Avoid caffeine and certain over-the-counter cold medicines. These may make you feel worse. Ask your pharmacists which medicines to avoid.  Keep all follow-up visits as told by your health care provider. This is important. Where to find more information  The [employer] on Mental Illness (NAMI): https://www.nami.org  Social Anxiety Association: https://socialphobia.org  Mental Health America [facility]): https://www.stevens-henderson.com/  Anxiety and Depression Association of [place] (ADAA): http://miller-hamilton.net/ Contact a health care provider if:  Your symptoms do not improve or get worse.  You have signs of depression, such as: ? Persistent sadness or moodiness. ? Loss of enjoyment in activities that used to bring you joy. ? Change in weight or eating. ? Changes in sleeping habits. ? Avoiding friends or family members more than usual. ? Loss of energy for normal tasks. ? Feeling guilty or worthless.  You become more isolated than you normally are.  You find it more and more difficult to speak or interact with others.  You are using drugs.  You are drinking more alcohol than usual. Get help right away if:  You harm yourself.  You have suicidal thoughts. If you ever feel like you may hurt yourself or others, or have thoughts about taking your own life, get help right away. You can go to your nearest emergency department or call:  Your local emergency services (911 in the [place]  A suicide crisis helpline, such as the  National Suicide Prevention Lifeline at 561-674-7335. This is open 24 hours a day. Summary  Social anxiety disorder (SAD) may cause you to feel nervous, afraid, or embarrassed when you are around other people in social situations.  SAD is a common mental disorder. It can develop at any time, but it usually starts in the teenage years.  Treatment includes talk therapy, exposure therapy, medicines, biofeedback, and relaxation techniques. It can involve a combination of treatments. This information is not intended to replace advice given to you by your health care provider. Make sure you discuss any questions you have with your health care provider. Document Revised: 08/17/2018 Document Reviewed: 08/17/2018 Elsevier Patient Education  2020 Elsevier Inc.  Generalized Anxiety Disorder, Adult Generalized anxiety disorder (GAD) is a mental health disorder. People with this condition constantly worry about everyday events. Unlike normal anxiety, worry related to GAD is not triggered by a specific event. These worries also do not fade or get better with time. GAD interferes with life functions, including relationships, work, and school. GAD can vary from mild to severe. People with severe GAD can have intense waves of anxiety with physical symptoms (panic attacks). What are the causes? The exact cause of GAD is not known. What increases the risk? This condition is more likely to develop in:  Women.  People who have a family history of anxiety disorders.  People who are very shy.  People who experience very stressful life events, such as the death of a loved one.  People who have a very stressful family environment. What are the signs or symptoms? People with GAD often worry excessively about many things in their lives, such as their health and family. They may also be overly concerned about:  Doing well at work.  Being on time.  Natural disasters.  Friendships. Physical symptoms of  GAD include:  Fatigue.  Muscle tension or having muscle twitches.  Trembling or feeling shaky.  Being easily startled.  Feeling like your heart is pounding or racing.  Feeling out of breath or like you  cannot take a deep breath.  Having trouble falling asleep or staying asleep.  Sweating.  Nausea, diarrhea, or irritable bowel syndrome (IBS).  Headaches.  Trouble concentrating or remembering facts.  Restlessness.  Irritability. How is this diagnosed? Your health care provider can diagnose GAD based on your symptoms and medical history. You will also have a physical exam. The health care provider will ask specific questions about your symptoms, including how severe they are, when they started, and if they come and go. Your health care provider may ask you about your use of alcohol or drugs, including prescription medicines. Your health care provider may refer you to a mental health specialist for further evaluation. Your health care provider will do a thorough examination and may perform additional tests to rule out other possible causes of your symptoms. To be diagnosed with GAD, a person must have anxiety that:  Is out of his or her control.  Affects several different aspects of his or her life, such as work and relationships.  Causes distress that makes him or her unable to take part in normal activities.  Includes at least three physical symptoms of GAD, such as restlessness, fatigue, trouble concentrating, irritability, muscle tension, or sleep problems. Before your health care provider can confirm a diagnosis of GAD, these symptoms must be present more days than they are not, and they must last for six months or longer. How is this treated? The following therapies are usually used to treat GAD:  Medicine. Antidepressant medicine is usually prescribed for long-term daily control. Antianxiety medicines may be added in severe cases, especially when panic attacks  occur.  Talk therapy (psychotherapy). Certain types of talk therapy can be helpful in treating GAD by providing support, education, and guidance. Options include: ? Cognitive behavioral therapy (CBT). People learn coping skills and techniques to ease their anxiety. They learn to identify unrealistic or negative thoughts and behaviors and to replace them with positive ones. ? Acceptance and commitment therapy (ACT). This treatment teaches people how to be mindful as a way to cope with unwanted thoughts and feelings. ? Biofeedback. This process trains you to manage your body's response (physiological response) through breathing techniques and relaxation methods. You will work with a therapist while machines are used to monitor your physical symptoms.  Stress management techniques. These include yoga, meditation, and exercise. A mental health specialist can help determine which treatment is best for you. Some people see improvement with one type of therapy. However, other people require a combination of therapies. Follow these instructions at home:  Take over-the-counter and prescription medicines only as told by your health care provider.  Try to maintain a normal routine.  Try to anticipate stressful situations and allow extra time to manage them.  Practice any stress management or self-calming techniques as taught by your health care provider.  Do not punish yourself for setbacks or for not making progress.  Try to recognize your accomplishments, even if they are small.  Keep all follow-up visits as told by your health care provider. This is important. Contact a health care provider if:  Your symptoms do not get better.  Your symptoms get worse.  You have signs of depression, such as: ? A persistently sad, cranky, or irritable mood. ? Loss of enjoyment in activities that used to bring you joy. ? Change in weight or eating. ? Changes in sleeping habits. ? Avoiding friends or family  members. ? Loss of energy for normal tasks. ? Feelings of guilt or  worthlessness. Get help right away if:  You have serious thoughts about hurting yourself or others. If you ever feel like you may hurt yourself or others, or have thoughts about taking your own life, get help right away. You can go to your nearest emergency department or call:  Your local emergency services (911 in the [place]  A suicide crisis helpline, such as the National Suicide Prevention Lifeline at 469-810-2602. This is open 24 hours a day. Summary  Generalized anxiety disorder (GAD) is a mental health disorder that involves worry that is not triggered by a specific event.  People with GAD often worry excessively about many things in their lives, such as their health and family.  GAD may cause physical symptoms such as restlessness, trouble concentrating, sleep problems, frequent sweating, nausea, diarrhea, headaches, and trembling or muscle twitching.  A mental health specialist can help determine which treatment is best for you. Some people see improvement with one type of therapy. However, other people require a combination of therapies. This information is not intended to replace advice given to you by your health care provider. Make sure you discuss any questions you have with your health care provider. Document Revised: 02/26/2017 Document Reviewed: 02/04/2016 Elsevier Patient Education  2020 [employer]. Venlafaxine extended-release capsules What is this medicine? VENLAFAXINE(VEN la fax een) is used to treat depression, anxiety and panic disorder. This medicine may be used for other purposes; ask your health care provider or pharmacist if you have questions. COMMON BRAND NAME(S): Effexor XR What should I tell my health care provider before I take this medicine? They need to know if you have any of these conditions:  bleeding disorders  glaucoma  heart disease  high blood pressure  high  cholesterol  kidney disease  liver disease  low levels of sodium in the blood  mania or bipolar disorder  seizures  suicidal thoughts, plans, or attempt; a previous suicide attempt by you or a family  take medicines that treat or prevent blood clots  thyroid disease  an unusual or allergic reaction to venlafaxine, desvenlafaxine, other medicines, foods, dyes, or preservatives  pregnant or trying to get pregnant  breast-feeding How should I use this medicine? Take this medicine by mouth with a full glass of water. Follow the directions on the prescription label. Do not cut, crush, or chew this medicine. Take it with food. If needed, the capsule may be carefully opened and the entire contents sprinkled on a spoonful of cool applesauce. Swallow the applesauce/pellet mixture right away without chewing and follow with a glass of water to ensure complete swallowing of the pellets. Try to take your medicine at about the same time each day. Do not take your medicine more often than directed. Do not stop taking this medicine suddenly except upon the advice of your doctor. Stopping this medicine too quickly may cause serious side effects or your condition may worsen. A special MedGuide will be given to you by the pharmacist with each prescription and refill. Be sure to read this information carefully each time. Talk to your pediatrician regarding the use of this medicine in children. Special care may be needed. Overdosage: If you think you have taken too much of this medicine contact a poison control center or emergency room at once. NOTE: This medicine is only for you. Do not share this medicine with others. What if I miss a dose? If you miss a dose, take it as soon as you can. If it is almost time  for your next dose, take only that dose. Do not take double or extra doses. What may interact with this medicine? Do not take this medicine with any of the following medications:  certain  medicines for fungal infections like fluconazole, itraconazole, ketoconazole, posaconazole, voriconazole  cisapride  desvenlafaxine  dronedarone  duloxetine  levomilnacipran  linezolid  MAOIs like Carbex, Eldepryl, Marplan, Nardil, and Parnate  methylene blue (injected into a vein)  milnacipran  pimozide  thioridazine This medicine may also interact with the following medications:  amphetamines  aspirin and aspirin-like medicines  certain medicines for depression, anxiety, or psychotic disturbances  certain medicines for migraine headaches like almotriptan, eletriptan, frovatriptan, naratriptan, rizatriptan, sumatriptan, zolmitriptan  certain medicines for sleep  certain medicines that treat or prevent blood clots like dalteparin, enoxaparin, warfarin  cimetidine  clozapine  diuretics  fentanyl  furazolidone  indinavir  isoniazid  lithium  metoprolol  NSAIDS, medicines for pain and inflammation, like ibuprofen or naproxen  other medicines that prolong the QT interval (cause an abnormal heart rhythm) like dofetilide, ziprasidone  procarbazine  rasagiline  supplements like St. John's wort, kava kava, valerian  tramadol  tryptophan This list may not describe all possible interactions. Give your health care provider a list of all the medicines, herbs, non-prescription drugs, or dietary supplements you use. Also tell them if you smoke, drink alcohol, or use illegal drugs. Some items may interact with your medicine. What should I watch for while using this medicine? Tell your doctor if your symptoms do not get better or if they get worse. Visit your doctor or health care professional for regular checks on your progress. Because it may take several weeks to see the full effects of this medicine, it is important to continue your treatment as prescribed by your doctor. Patients and their families should watch out for new or worsening thoughts of suicide  or depression. Also watch out for sudden changes in feelings such as feeling anxious, agitated, panicky, irritable, hostile, aggressive, impulsive, severely restless, overly excited and hyperactive, or not being able to sleep. If this happens, especially at the beginning of treatment or after a change in dose, call your health care professional. This medicine can cause an increase in blood pressure. Check with your doctor for instructions on monitoring your blood pressure while taking this medicine. You may get drowsy or dizzy. Do not drive, use machinery, or do anything that needs mental alertness until you know how this medicine affects you. Do not stand or sit up quickly, especially if you are an older patient. This reduces the risk of dizzy or fainting spells. Alcohol may interfere with the effect of this medicine. Avoid alcoholic drinks. Your mouth may get dry. Chewing sugarless gum, sucking hard candy and drinking plenty of water will help. Contact your doctor if the problem does not go away or is severe. What side effects may I notice from receiving this medicine? Side effects that you should report to your doctor or health care professional as soon as possible:  allergic reactions like skin rash, itching or hives, swelling of the face, lips, or tongue  anxious  breathing problems  confusion  changes in vision  chest pain  confusion  elevated mood, decreased need for sleep, racing thoughts, impulsive behavior  eye pain  fast, irregular heartbeat  feeling faint or lightheaded, falls  feeling agitated, angry, or irritable  hallucination, loss of contact with reality  high blood pressure  loss of balance or coordination  palpitations  redness,  blistering, peeling or loosening of the skin, including inside the mouth  restlessness, pacing, inability to keep still  seizures  stiff muscles  suicidal thoughts or other mood changes  trouble passing urine or change in  the amount of urine  trouble sleeping  unusual bleeding or bruising  unusually weak or tired  vomiting Side effects that usually do not require medical attention (report to your doctor or health care professional if they continue or are bothersome):  change in sex drive or performance  change in appetite or weight  constipation  dizziness  dry mouth  headache  increased sweating  nausea  tired This list may not describe all possible side effects. Call your doctor for medical advice about side effects. You may report side effects to FDA at 1-800-FDA-1088. Where should I keep my medicine? Keep out of the reach of children. Store at a controlled temperature between 20 and 25 degrees C (68 degrees and 77 degrees F), in a dry place. Throw away any unused medicine after the expiration date. NOTE: This sheet is a summary. It may not cover all possible information. If you have questions about this medicine, talk to your doctor, pharmacist, or health care provider.  2020 Elsevier/Gold Standard (2018-03-08 12:06:43)  Psychiatric/Counseling Resources Discussed As Follows:  If Emergency please seek Emergency Room Care Immediately or Call 911.   [facility] Address:  [address] [place], [place] [address] Phone: [phone] Website : [email or web address]   [facility] Address:  [address] [place], [place] [address] Phone: [phone] Fax: [phone] Website: [email or web address] How To Access Our Services Because our main goal is to meet the needs of our consumers, [facility] operates on a walk-in basis! To access services, there are just 3 easy steps: 1) Walk in any Monday, Wednesday or Friday between 8:00 am and 3:00 pm and complete our consumer paperwork 2) A Comprehensive Clinical Assessment (CCA) will be completed and appropriate service recommendations will be provided 3) Recommendations are sent to [facility]  team members and the appropriate staff will call you within days. Advanced Access Open M - F, 8:00 am - 8:00 pm  Mental health crisis services for all age groups  Triage  Psychiatric Evaluations  Involuntary Commitments  [facility]  Address: [address] [place], [place] [address] Website : [email or web address] Walk in's accepted see web site or call for more information Phone : [phone] Also has [facility] [phone]    [facility] Today Find a therapist by searching online in your area or specialist by your diagnosis Website:  [email or web address]

## 2019-04-21 NOTE — Progress Notes (Signed)
Patient: Yolanda Curtis, Female    DOB: 1975/05/13, 44 y.o.   MRN: [id number] Visit Date: 04/21/2019  Today's Provider: Jairo Ben, FNP   Chief Complaint  Patient presents with  . New Patient (Initial Visit)   Subjective:    New Patient Yolanda Curtis is a 44 y.o. female who presents today for health maintenance and establish care, patient states that she moved here for [place] and has not had a PCP since. She is married 4 children and 2 children at home, and 2 of them have moved out and are adults.  She is currently working from home, she has been doing this for 3 years, she reports she did enjoy it at first but now she misses getting out of the house.  Patient reports that she is up to date on immunizations and reports that she had flu vaccine at [facility] pharmacy 01/2019. She feels fairly well, patient states that she would like to have a referral ordered for gastrointestinal physician  , she states that she has a history of gastritis and states that she believes that she is due for colonoscopy.She was told she was suppose to have repeat in 5 years. She has a history of ischemic colitis  And has to have one every 5 years. Previous was in [place]. No rectal bleeding since 4 1/2 years ago. She reports last  colonoscopy she was told just inflammation and no polyps. She did have polyps 10 years ago - benign she reports. No constipation.   Patient reports last colonoscopy was 63yrs ago and was normal. Mild reflux history she reports. She use to use PPI, her mother has some dementia and she would rather above.    Patient also has concerns of decreased energy and weight gain and believes that it could be related to anxiety and would like to discuss further today.  She has some cervical issues, she reports a herniated disc in lumbar and cervical spine.  Since 2011. She denies any new or changing symptoms. 4/10 pain at worst. Right arm mild numbness comes and goes. She had  injections, antiinflammatories and muscle relaxer's.  She completed physical therapy and says neck ismuch better than when this originally happened.  He does not like the way muscle relaxers make her feel and it also hurts her stomach.  She sees Dr. Tinnie Gens, gynecology. PAPS  normal. Just started on birth control. Mammogram in december had recheck on left side and was told dense tissue. Repeat in one year.    She reports she is not exercising . She reports she is sleeping fairly well.  Has a history of recurrent urinary tract infections, she does have a history of having Macrobid for prophylaxis when needed.  She is has seen Dr. Sheppard Penton in urology for this who prescribes it.  Denies any urinary symptoms at this time.  She does have diarrhea with Anxiety situations. She was previously on Effexor XR around 20 years ago and it helped her tremendously.  She reports that she has been having increased anxiety with getting in the car and driving, to the point that she does not got to eat at times.  She thinks that this is because she has been in the house so much.  She is not invoked on counseling.  She has a desire to restart Effexor if able to.  Denies any homicidal or suicidal ideations or tendencies.  She has no homicidal or suicidal intents.  She denies  ever being suicidal or homicidal in the past.  Patient  denies any fever, body aches,chills, rash, chest pain, shortness of breath, nausea, vomiting, or diarrhea.  She has no other concerns at today's visit.  -----------------------------------------------------------------   Review of Systems  Constitutional: Positive for activity change, appetite change and fatigue. Negative for chills, diaphoresis, fever and unexpected weight change.  HENT: Positive for postnasal drip. Negative for congestion, dental problem, drooling, ear discharge, ear pain, facial swelling, hearing loss, mouth sores, nosebleeds, rhinorrhea, sinus pressure, sinus pain, sneezing,  sore throat, tinnitus, trouble swallowing and voice change.   Eyes: Negative for photophobia, pain, discharge, redness, itching and visual disturbance.  Respiratory: Negative.   Cardiovascular: Negative.   Gastrointestinal: Positive for abdominal distention and diarrhea. Negative for abdominal pain, anal bleeding, blood in stool, constipation, nausea, rectal pain and vomiting.  Endocrine: Positive for polyphagia. Negative for cold intolerance, heat intolerance, polydipsia and polyuria.  Genitourinary: Positive for dysuria (History of sees Dr. Sheppard Penton, denies any at this time.) and menstrual problem (He sees gynecology Dr. Shawnie Pons and reports that she has been having increasing symptoms prior to her menstrual cycle that she was recently started on birth control for.  She keeps regular follow-ups with her gynecologist.). Negative for decreased urine volume, difficulty urinating, dyspareunia, enuresis, flank pain, frequency, genital sores, hematuria, pelvic pain, urgency, vaginal bleeding, vaginal discharge and vaginal pain.  Musculoskeletal: Positive for back pain, neck pain (Chronic history) and neck stiffness (Chronic history.  She denies any further work-up for her neck pain.  Or her back pain.  She will have records sent.  She reports all are much improved and this is chronic.). Negative for arthralgias, gait problem, joint swelling and myalgias.  Skin: Negative.   Allergic/Immunologic: Positive for environmental allergies.  Neurological: Negative.   Hematological: Negative.   Psychiatric/Behavioral: Negative for agitation, behavioral problems, confusion, decreased concentration, dysphoric mood, hallucinations, self-injury, sleep disturbance and suicidal ideas. The patient is nervous/anxious. The patient is not hyperactive.     Social History She  reports that she has quit smoking. She has never used smokeless tobacco. She reports current alcohol use of about 10.0 standard drinks of alcohol per week.  She reports that she does not use drugs. Social History   Socioeconomic History  . Marital status: Single    Spouse name: Not on file  . Number of children: Not on file  . Years of education: Not on file  . Highest education level: Not on file  Occupational History  . Not on file  Tobacco Use  . Smoking status: Former Games developer  . Smokeless tobacco: Never Used  Substance and Sexual Activity  . Alcohol use: Yes    Alcohol/week: 10.0 standard drinks    Types: 10 Glasses of wine per week  . Drug use: No  . Sexual activity: Yes    Birth control/protection: Surgical  Other Topics Concern  . Not on file  Social History Narrative  . Not on file   Social Determinants of Health   Financial Resource Strain:   . Difficulty of Paying Living Expenses: Not on file  Food Insecurity:   . Worried About Programme researcher, broadcasting/film/video in the Last Year: Not on file  . Ran Out of Food in the Last Year: Not on file  Transportation Needs:   . Lack of Transportation (Medical): Not on file  . Lack of Transportation (Non-Medical): Not on file  Physical Activity:   . Days of Exercise per Week: Not on file  .  Minutes of Exercise per Session: Not on file  Stress:   . Feeling of Stress : Not on file  Social Connections:   . Frequency of Communication with Friends and Family: Not on file  . Frequency of Social Gatherings with Friends and Family: Not on file  . Attends Religious Services: Not on file  . Active Member of Clubs or Organizations: Not on file  . Attends Banker Meetings: Not on file  . Marital Status: Not on file    Patient Active Problem List   Diagnosis Date Noted  . Generalized anxiety disorder 04/21/2019  . Depression 04/21/2019  . Social anxiety disorder 04/21/2019  . Fatigue 04/21/2019  . Chronic gastritis without bleeding 04/21/2019  . History of ischemic colitis 04/21/2019  . Polyphagia 04/21/2019  . Screening for blood or protein in urine 04/21/2019  . Frequent UTI  08/23/2018  . Bacterial vaginosis 08/23/2018  . HSV (herpes simplex virus) anogenital infection 08/23/2018  . PMDD (premenstrual dysphoric disorder) 08/23/2018    Past Surgical History:  Procedure Laterality Date  . AXILLARY HIDRADENITIS EXCISION    . CESAREAN SECTION  x 2  . TUBAL LIGATION      Family History  Family Status  Relation Name Status  . Father  (Not Specified)  . Mother  (Not Specified)  . Mat Aunt  (Not Specified)   Her family history includes Breast cancer (age of onset: 73) in her maternal aunt; Cancer - Other in her maternal aunt; Depression in her mother; Hypercholesterolemia in her father and mother; Hyperlipidemia in her mother; Hypertension in her father; Stroke in her father.     No Known Allergies  Previous Medications   CHOLECALCIFEROL (VITAMIN D3) 50 MCG (2000 UT) CAPSULE    Take by mouth.   CYCLOBENZAPRINE (FLEXERIL) 5 MG TABLET    Take 5 mg by mouth 3 (three) times daily as needed.   HYDROCODONE-ACETAMINOPHEN (NORCO/VICODIN) 5-325 MG TABLET    Take one tablet at night for pain; may take up to every 6 hours as needed for pain if not working or driving   LORATADINE (CLARITIN) 10 MG TABLET    Take 10 mg by mouth daily.   MULTIPLE VITAMIN (MULTIVITAMIN) TABLET    Take 1 tablet by mouth daily.   NAPROXEN (NAPROSYN) 500 MG TABLET    Take 500 mg by mouth 2 (two) times daily.   NITROFURANTOIN, MACROCRYSTAL-MONOHYDRATE, (MACROBID) 100 MG CAPSULE       NORETHINDRONE ACETATE-ETHINYL ESTRAD-FE (LOESTRIN 24 FE) 1-20 MG-MCG(24) TABLET    Take 1 tablet by mouth daily.    Patient Care Team: Berniece Pap, FNP as PCP - General (Family Medicine)      Objective:   Vitals: BP 122/84   Pulse 88   Temp (!) 97 F (36.1 C) (Oral)   Resp 16   Ht 5' 1.5" (1.562 m)   Wt 149 lb (67.6 kg)   LMP  (Within Weeks)   SpO2 99%   BMI 27.70 kg/m    Body mass index is 27.7 kg/m.   Physical Exam Vitals reviewed.  Constitutional:      General: She is not in acute  distress.    Appearance: Normal appearance. She is well-developed. She is obese. She is not ill-appearing, toxic-appearing or diaphoretic.     Interventions: She is not intubated. HENT:     Head: Normocephalic and atraumatic.     Right Ear: Tympanic membrane, ear canal and external ear normal. There is no impacted cerumen.  Left Ear: Tympanic membrane, ear canal and external ear normal. There is no impacted cerumen.     Nose: Nose normal. No congestion or rhinorrhea.     Mouth/Throat:     Mouth: Mucous membranes are moist.     Pharynx: No oropharyngeal exudate or posterior oropharyngeal erythema.  Eyes:     General: Lids are normal. No scleral icterus.       Right eye: No discharge.        Left eye: No discharge.     Extraocular Movements: Extraocular movements intact.     Conjunctiva/sclera: Conjunctivae normal.     Right eye: Right conjunctiva is not injected. No exudate or hemorrhage.    Left eye: Left conjunctiva is not injected. No exudate or hemorrhage.    Pupils: Pupils are equal, round, and reactive to light.  Neck:     Thyroid: No thyroid mass or thyromegaly.     Vascular: Normal carotid pulses. No carotid bruit, hepatojugular reflux or JVD.     Trachea: Trachea and phonation normal. No tracheal tenderness or tracheal deviation.     Meningeal: Brudzinski's sign and Kernig's sign absent.  Cardiovascular:     Rate and Rhythm: Normal rate and regular rhythm.     Pulses: Normal pulses.          Radial pulses are 2+ on the right side and 2+ on the left side.       Dorsalis pedis pulses are 2+ on the right side and 2+ on the left side.       Posterior tibial pulses are 2+ on the right side and 2+ on the left side.     Heart sounds: Normal heart sounds, S1 normal and S2 normal. Heart sounds not distant. No murmur. No friction rub. No gallop.   Pulmonary:     Effort: Pulmonary effort is normal. No tachypnea, bradypnea, accessory muscle usage or respiratory distress. She is not  intubated.     Breath sounds: Normal breath sounds. No stridor. No wheezing, rhonchi or rales.  Chest:     Chest wall: No tenderness.  Abdominal:     General: Bowel sounds are normal. There is no distension or abdominal bruit.     Palpations: Abdomen is soft. There is no shifting dullness, fluid wave, hepatomegaly, splenomegaly, mass or pulsatile mass.     Tenderness: There is no abdominal tenderness. There is no right CVA tenderness, left CVA tenderness, guarding or rebound.     Hernia: No hernia is present.  Genitourinary:    Comments: deferred sees Dr. Shawnie PonsPratt for gynecological care/ denies any concerns. No rectal bleeding or pain .  Musculoskeletal:        General: No swelling, tenderness, deformity or signs of injury. Normal range of motion.     Cervical back: Full passive range of motion without pain, normal range of motion and neck supple. No edema, erythema or rigidity. No spinous process tenderness or muscular tenderness. Normal range of motion.     Right lower leg: No edema.     Left lower leg: No edema.  Lymphadenopathy:     Head:     Right side of head: No submental, submandibular, tonsillar, preauricular, posterior auricular or occipital adenopathy.     Left side of head: No submental, submandibular, tonsillar, preauricular, posterior auricular or occipital adenopathy.     Cervical: No cervical adenopathy.     Right cervical: No superficial, deep or posterior cervical adenopathy.    Left cervical: No superficial, deep or  posterior cervical adenopathy.     Upper Body:     Right upper body: No supraclavicular or pectoral adenopathy.     Left upper body: No supraclavicular or pectoral adenopathy.  Skin:    General: Skin is warm and dry.     Capillary Refill: Capillary refill takes less than 2 seconds.     Coloration: Skin is not jaundiced or pale.     Findings: No abrasion, bruising, burn, ecchymosis, erythema, lesion, petechiae or rash.     Nails: There is no clubbing.    Neurological:     General: No focal deficit present.     Mental Status: She is alert and oriented to person, place, and time.     GCS: GCS eye subscore is 4. GCS verbal subscore is 5. GCS motor subscore is 6.     Cranial Nerves: No cranial nerve deficit.     Sensory: No sensory deficit.     Motor: No weakness, tremor, atrophy, abnormal muscle tone or seizure activity.     Coordination: Coordination normal.     Gait: Gait normal.     Deep Tendon Reflexes: Reflexes are normal and symmetric. Reflexes normal. Babinski sign absent on the right side. Babinski sign absent on the left side.     Reflex Scores:      Tricep reflexes are 2+ on the right side and 2+ on the left side.      Bicep reflexes are 2+ on the right side and 2+ on the left side.      Brachioradialis reflexes are 2+ on the right side and 2+ on the left side.      Patellar reflexes are 2+ on the right side and 2+ on the left side.      Achilles reflexes are 2+ on the right side and 2+ on the left side. Psychiatric:        Mood and Affect: Mood normal.        Speech: Speech normal.        Behavior: Behavior normal.        Thought Content: Thought content normal.        Judgment: Judgment normal.     Comments: She does wear gloves/ her glasses and mask to office.       Depression Screen PHQ 2/9 Scores 04/21/2019  PHQ - 2 Score 3  PHQ- 9 Score 9      Assessment & Plan:     Routine Health Maintenance and Physical Exam  Exercise Activities and Dietary recommendations Goals   None     Immunization History  Administered Date(s) Administered  . Influenza, Quadrivalent, Recombinant, Inj, Pf 04/11/2018  . Influenza-Unspecified 08/26/2018, 12/22/2018    Health Maintenance  Topic Date Due  . HIV Screening  10/22/1992  . TETANUS/TDAP  10/22/1996  . PAP SMEAR-Modifier  08/22/2021  . INFLUENZA VACCINE  Completed   Discussed health benefits of physical activity, and encouraged her to engage in regular exercise  appropriate for her age and condition.  Recommend regular dental and eye exams yearly.  1. Screening cholesterol level Patient ended up not needing cholesterols screening.  She has had cholesterol screening at her 12/27/2018 work lab work.  Cholesterol was 190, HDL 68, cholesterol HDL ratio 2.8, direct LDL was 74, triglycerides are 77.  All within normal range.  Brought a copy of the lab work with her and will have it scanned into her chart.  2. Vitamin D insufficiency She is currently on vitamin D supplementation. -  VITAMIN D 25 Hydroxy (Vit-D Deficiency, Fractures)  3. Screening for thyroid disorder Lab ordered. - TSH  4. Screening for blood or protein in urine  - POCT urinalysis dipstick  Results for orders placed or performed in visit on 04/21/19 (from the past 24 hour(s))  POCT urinalysis dipstick     Status: Normal   Collection Time: 04/21/19  1:17 PM  Result Value Ref Range   Color, UA yellow    Clarity, UA clear    Glucose, UA Negative Negative   Bilirubin, UA negative    Ketones, UA negative    Spec Grav, UA 1.015 1.010 - 1.025   Blood, UA negative    pH, UA 6.0 5.0 - 8.0   Protein, UA Negative Negative   Urobilinogen, UA 0.2 0.2 or 1.0 E.U./dL   Nitrite, UA negative    Leukocytes, UA Negative Negative   Appearance     Odor      5. Polyphagia Will screen for diabetes.  Likely relation to depression anxiety. - Comprehensive Metabolic Panel (CMET) - CBC with Differential/Platelet  6. History of ischemic colitis Denies any current issues however does need to see gastroenterology. - Comprehensive Metabolic Panel (CMET) - CBC with Differential/Platelet - Ambulatory referral to Gastroenterology  7. Chronic gastritis without bleeding, unspecified gastritis type Denies any recent rectal bleeding, no bleeding since 4-1/2 years ago.  She will follow-up with gastroenterology for colonoscopy and further work-up if needed. - TSH - POCT urinalysis dipstick - Ambulatory  referral to Gastroenterology  8. Fatigue, unspecified type Labs ordered, discussed sleep hygiene. - Comprehensive Metabolic Panel (CMET) - VITAMIN D 25 Hydroxy (Vit-D Deficiency, Fractures) - CBC with Differential/Platelet - Ambulatory referral to Gastroenterology  9. Social anxiety disorder  Recommend counseling, resources were given. 10. Generalized anxiety disorder  Meds ordered this encounter  Medications  . venlafaxine XR (EFFEXOR XR) 37.5 MG 24 hr capsule    Sig: Take 1 capsule (37.5 mg total) by mouth daily with breakfast.    Dispense:  30 capsule    Refill:  0   Will return within 1 month to the office, discussed black box warnings and when to seek emergency medical treatment.  She has been on Effexor previously extended release and would like to restart this.  Was on it 20 years ago. 11. Depression, unspecified depression type As above for general anxiety disorder, will start Effexor XR.  Will return to the office in 1 month.  Red flag symptoms given.  Counseling recommended.  Resources given.     Advised patient call the office or your primary care doctor for an appointment if no improvement within 72 hours or if any symptoms change or worsen at any time  Advised ER or urgent Care if after hours or on weekend. Call 911 for emergency symptoms at any time.Patinet verbalized understanding of all instructions given/reviewed and treatment plan and has no further questions or concerns at this time.    Return in about 1 month (around 05/22/2019), or if symptoms worsen or fail to improve, for at any time for any worsening symptoms, Go to Emergency room/ urgent care if worse. The entirety of the information documented in the History of Present Illness, Review of Systems and Physical Exam were personally obtained by me. Portions of this information were initially documented by the  Certified Medical Assistant whose name is documented in Epic and reviewed by me for thoroughness and  accuracy.  I have personally performed the exam and reviewed the chart and it  is accurate to the best of my knowledge.  Museum/gallery conservatorDragon technology has been used and any errors in dictation or transcription are unintentional.  Eula FriedMichelle S. Flinchum FNP-C  [facility]  --------------------------------------------------------------------

## 2019-04-29 LAB — TSH: TSH: 1.32 u[IU]/mL (ref 0.450–4.500)

## 2019-04-29 LAB — CBC WITH DIFFERENTIAL/PLATELET
Basophils Absolute: 0.1 10*3/uL (ref 0.0–0.2)
Basos: 1 %
EOS (ABSOLUTE): 0.1 10*3/uL (ref 0.0–0.4)
Eos: 2 %
Hematocrit: 41.3 % (ref 34.0–46.6)
Hemoglobin: 13.7 g/dL (ref 11.1–15.9)
Immature Grans (Abs): 0 10*3/uL (ref 0.0–0.1)
Immature Granulocytes: 0 %
Lymphocytes Absolute: 2.1 10*3/uL (ref 0.7–3.1)
Lymphs: 25 %
MCH: 32.4 pg (ref 26.6–33.0)
MCHC: 33.2 g/dL (ref 31.5–35.7)
MCV: 98 fL — ABNORMAL HIGH (ref 79–97)
Monocytes Absolute: 0.7 10*3/uL (ref 0.1–0.9)
Monocytes: 8 %
Neutrophils Absolute: 5.3 10*3/uL (ref 1.4–7.0)
Neutrophils: 64 %
Platelets: 314 10*3/uL (ref 150–450)
RBC: 4.23 x10E6/uL (ref 3.77–5.28)
RDW: 12 % (ref 11.7–15.4)
WBC: 8.3 10*3/uL (ref 3.4–10.8)

## 2019-04-29 LAB — COMPREHENSIVE METABOLIC PANEL
ALT: 17 IU/L (ref 0–32)
AST: 20 IU/L (ref 0–40)
Albumin/Globulin Ratio: 2 (ref 1.2–2.2)
Albumin: 4.7 g/dL (ref 3.8–4.8)
Alkaline Phosphatase: 54 IU/L (ref 39–117)
BUN/Creatinine Ratio: 13 (ref 9–23)
BUN: 10 mg/dL (ref 6–24)
Bilirubin Total: 0.3 mg/dL (ref 0.0–1.2)
CO2: 18 mmol/L — ABNORMAL LOW (ref 20–29)
Calcium: 9.4 mg/dL (ref 8.7–10.2)
Chloride: 103 mmol/L (ref 96–106)
Creatinine, Ser: 0.79 mg/dL (ref 0.57–1.00)
GFR calc Af Amer: 108 mL/min/{1.73_m2} (ref >59)
GFR calc non Af Amer: 93 mL/min/{1.73_m2} (ref >59)
Globulin, Total: 2.4 g/dL (ref 1.5–4.5)
Glucose: 86 mg/dL (ref 65–99)
Potassium: 4.2 mmol/L (ref 3.5–5.2)
Sodium: 142 mmol/L (ref 134–144)
Total Protein: 7.1 g/dL (ref 6.0–8.5)

## 2019-04-29 LAB — VITAMIN D 25 HYDROXY (VIT D DEFICIENCY, FRACTURES): Vit D, 25-Hydroxy: 38.8 ng/mL (ref 30.0–100.0)

## 2019-05-01 NOTE — Progress Notes (Signed)
Labs within normal. Follow up as needed.

## 2019-05-02 MED ORDER — NORETHIN ACE-ETH ESTRAD-FE 1-20 MG-MCG PO TABS: 1.0000 | ORAL_TABLET | Freq: Every day | ORAL | 3 refills | Status: DC

## 2019-05-16 ENCOUNTER — Other Ambulatory Visit: Payer: Self-pay | Admitting: Adult Health

## 2019-05-16 MED ORDER — VENLAFAXINE HCL ER 37.5 MG PO CP24: 37.5000 mg | ORAL_CAPSULE | Freq: Every day | ORAL | 0 refills | Status: DC

## 2019-05-23 ENCOUNTER — Encounter: Payer: Self-pay | Admitting: Adult Health

## 2019-05-23 ENCOUNTER — Other Ambulatory Visit: Payer: Self-pay

## 2019-05-23 ENCOUNTER — Ambulatory Visit (INDEPENDENT_AMBULATORY_CARE_PROVIDER_SITE_OTHER): Payer: 59 | Admitting: Adult Health

## 2019-05-23 VITALS — BP 116/76 | HR 78 | Temp 97.3°F | Resp 16 | Wt 146.6 lb

## 2019-05-23 DIAGNOSIS — F4010 Social phobia, unspecified: Secondary | ICD-10-CM

## 2019-05-23 DIAGNOSIS — E559 Vitamin D deficiency, unspecified: Secondary | ICD-10-CM

## 2019-05-23 MED ORDER — VENLAFAXINE HCL ER 75 MG PO CP24: 75.0000 mg | ORAL_CAPSULE | Freq: Every day | ORAL | 0 refills | Status: DC

## 2019-05-23 NOTE — Progress Notes (Signed)
Patient: Yolanda Curtis Female    DOB: 1975/06/02   44 y.o.   MRN: [id number] Visit Date: 05/23/2019  Today's Provider: Jairo Ben, FNP   Chief Complaint  Patient presents with  . Anxiety   Subjective:     Anxiety Presents for follow-up (patient was started on Effexor 37.5mg ) visit. Symptoms include irritability (improving) and nervous/anxious behavior. Patient reports no chest pain, compulsions, confusion, decreased concentration, depressed mood, dizziness, dry mouth, excessive worry, feeling of choking, hyperventilation, impotence, insomnia, malaise, muscle tension, nausea, obsessions, palpitations, panic, restlessness, shortness of breath or suicidal ideas. The quality of sleep is fair. Nighttime awakenings: occasional.   Compliance with medications is 76-100%.   She has noticed a difference in the ways she feels and notes she is improved, and has less anxiety when going out. She is sleeping better still not great. She would like to increase the dosage given the benefit she has received in one month already.  Patient  denies any fever, body aches,chills, rash, chest pain, shortness of breath, nausea, vomiting, or diarrhea.  Denies any suicidal/ homicidal ideations or intents. She denies any other concerns or today's visit.   No Known Allergies   Current Outpatient Medications:  .  Cholecalciferol (VITAMIN D3) 50 MCG (2000 UT) capsule, Take by mouth., Disp: , Rfl:  .  loratadine (CLARITIN) 10 MG tablet, Take 10 mg by mouth daily., Disp: , Rfl:  .  Multiple Vitamin (MULTIVITAMIN) tablet, Take 1 tablet by mouth daily., Disp: , Rfl:  .  norethindrone-ethinyl estradiol (JUNEL FE 1/20) 1-20 MG-MCG tablet, Take 1 tablet by mouth daily., Disp: 3 Package, Rfl: 3 .  venlafaxine XR (EFFEXOR XR) 37.5 MG 24 hr capsule, Take 1 capsule (37.5 mg total) by mouth daily with breakfast., Disp: 30 capsule, Rfl: 0 .  nitrofurantoin, macrocrystal-monohydrate, (MACROBID) 100 MG capsule,  , Disp: , Rfl:   Review of Systems  Constitutional: Positive for irritability (improving).  Respiratory: Negative for shortness of breath.   Cardiovascular: Negative for chest pain and palpitations.  Gastrointestinal: Negative for nausea.  Genitourinary: Negative for impotence.  Neurological: Negative for dizziness.  Psychiatric/Behavioral: Negative for confusion, decreased concentration and suicidal ideas. The patient is nervous/anxious. The patient does not have insomnia.     Social History   Tobacco Use  . Smoking status: Former Games developer  . Smokeless tobacco: Never Used  Substance Use Topics  . Alcohol use: Yes    Alcohol/week: 10.0 standard drinks    Types: 10 Glasses of wine per week      Objective:   BP 116/76   Pulse 78   Temp (!) 97.3 F (36.3 C) (Oral)   Resp 16   Wt 146 lb 9.6 oz (66.5 kg)   SpO2 99%   BMI 27.25 kg/m  Vitals:   05/23/19 1604  BP: 116/76  Pulse: 78  Resp: 16  Temp: (!) 97.3 F (36.3 C)  TempSrc: Oral  SpO2: 99%  Weight: 146 lb 9.6 oz (66.5 kg)  Body mass index is 27.25 kg/m. Temporal not oral temperature was taken.   Physical Exam Vitals reviewed.  Constitutional:      General: She is not in acute distress.    Appearance: Normal appearance. She is not ill-appearing, toxic-appearing or diaphoretic.  HENT:     Head: Normocephalic and atraumatic.     Right Ear: External ear normal.     Left Ear: External ear normal.     Nose: Nose normal.  Mouth/Throat:     Mouth: Mucous membranes are moist.  Eyes:     Extraocular Movements: Extraocular movements intact.     Pupils: Pupils are equal, round, and reactive to light.  Neck:     Vascular: No carotid bruit.  Cardiovascular:     Rate and Rhythm: Normal rate and regular rhythm.     Pulses: Normal pulses.     Heart sounds: Normal heart sounds. No murmur. No friction rub. No gallop.   Pulmonary:     Effort: Pulmonary effort is normal. No respiratory distress.     Breath sounds:  Normal breath sounds. No stridor. No wheezing, rhonchi or rales.  Chest:     Chest wall: No tenderness.  Musculoskeletal:        General: Normal range of motion.     Cervical back: Normal range of motion and neck supple. No rigidity or tenderness.  Lymphadenopathy:     Cervical: No cervical adenopathy.  Skin:    General: Skin is warm and dry.     Capillary Refill: Capillary refill takes less than 2 seconds.  Neurological:     Mental Status: She is alert and oriented to person, place, and time.     Cranial Nerves: No cranial nerve deficit.     Sensory: No sensory deficit.     Motor: No weakness.     Coordination: Coordination normal.     Gait: Gait normal.     Deep Tendon Reflexes: Reflexes normal.  Psychiatric:        Mood and Affect: Mood normal.        Behavior: Behavior normal.        Thought Content: Thought content normal.        Judgment: Judgment normal.      No results found for any visits on 05/23/19.     Assessment & Plan    Social anxiety disorder - Plan: VITAMIN D 25 Hydroxy (Vit-D Deficiency, Fractures)  Vitamin D insufficiency - Plan: VITAMIN D 25 Hydroxy (Vit-D Deficiency, Fractures)  Meds ordered this encounter  Medications  . venlafaxine XR (EFFEXOR-XR) 75 MG 24 hr capsule    Sig: Take 1 capsule (75 mg total) by mouth daily with breakfast.    Dispense:  90 capsule    Refill:  0    Medications Discontinued During This Encounter  Medication Reason  . HYDROcodone-acetaminophen (NORCO/VICODIN) 5-325 MG tablet Prescription never filled  . cyclobenzaprine (FLEXERIL) 5 MG tablet Completed Course  . naproxen (NAPROSYN) 500 MG tablet Completed Course  . venlafaxine XR (EFFEXOR XR) 37.5 MG 24 hr capsule Completed Course   Orders Placed This Encounter  Procedures  . VITAMIN D 25 Hydroxy (Vit-D Deficiency, Fractures)   Vitamin D recheck lab in 3 months- order was placed. She is currently taking Vitamin D 3 at 4,000 international units daily by mouth.    Recommend counseling as well as stress reduction and relaxation.   Addressed  acute medical problems today requiring 35 minutes reviewing her medical record, counseling patient regarding her conditions and coordination of care.    Return in about 2 months (around 07/21/2019), or if symptoms worsen or fail to improve, for at any time for any worsening symptoms, Go to Emergency room/ urgent care if worse. She prefers a virtual visit due to having young kids at home for next visit - this will be fine.   Advised patient call the office or your primary care doctor for an appointment if no improvement within 72 hours or  if any symptoms change or worsen at any time  Advised ER or urgent Care if after hours or on weekend. Call 911 for emergency symptoms at any time.Patinet verbalized understanding of all instructions given/reviewed and treatment plan and has no further questions or concerns at this time.       The entirety of the information documented in the History of Present Illness, Review of Systems and Physical Exam were personally obtained by me. Portions of this information were initially documented by the  Certified Medical Assistant whose name is documented in Epic and reviewed by me for thoroughness and accuracy.  I have personally performed the exam and reviewed the chart and it is accurate to the best of my knowledge.  Museum/gallery conservator has been used and any errors in dictation or transcription are unintentional.   Jairo Ben, FNP  [facility]

## 2019-05-23 NOTE — Patient Instructions (Addendum)
Meds ordered this encounter  Medications  . venlafaxine XR (EFFEXOR-XR) 75 MG 24 hr capsule    Sig: Take 1 capsule (75 mg total) by mouth daily with breakfast.    Dispense:  90 capsule    Refill:  0  Effexor XL 37.5mg  discontinued. Start dosage as ordered as above. Follow up sooner if needed.  Medications Discontinued During This Encounter  Medication Reason  . HYDROcodone-acetaminophen (NORCO/VICODIN) 5-325 MG tablet Prescription never filled  . cyclobenzaprine (FLEXERIL) 5 MG tablet Completed Course  . naproxen (NAPROSYN) 500 MG tablet Completed Course  . venlafaxine XR (EFFEXOR XR) 37.5 MG 24 hr capsule Completed Course   Orders Placed This Encounter  Procedures  . VITAMIN D 25 Hydroxy (Vit-D Deficiency, Fractures)  recheck vitamin D level walk in at lab Monday to Friday 8 to 3pm around three months from now.   Mindfulness-Based Stress Reduction Mindfulness-based stress reduction (MBSR) is a program that helps people learn to practice mindfulness. Mindfulness is the practice of intentionally paying attention to the present moment. It can be learned and practiced through techniques such as education, breathing exercises, meditation, and yoga. MBSR includes several mindfulness techniques in one program. MBSR works best when you understand the treatment, are willing to try new things, and can commit to spending time practicing what you learn. MBSR training may include learning about:  How your emotions, thoughts, and reactions affect your body.  New ways to respond to things that cause negative thoughts to start (triggers).  How to notice your thoughts and let go of them.  Practicing awareness of everyday things that you normally do without thinking.  The techniques and goals of different types of meditation. What are the benefits of MBSR? MBSR can have many benefits, which include helping you to:  Develop self-awareness. This refers to knowing and understanding  yourself.  Learn skills and attitudes that help you to participate in your own health care.  Learn new ways to care for yourself.  Be more accepting about how things are, and let things go.  Be less judgmental and approach things with an open mind.  Be patient with yourself and trust yourself more. MBSR has also been shown to:  Reduce negative emotions, such as depression and anxiety.  Improve memory and focus.  Change how you sense and approach pain.  Boost your body's ability to fight infections.  Help you connect better with other people.  Improve your sense of well-being. Follow these instructions at home:   Find a local in-person or online MBSR program.  Set aside some time regularly for mindfulness practice.  Find a mindfulness practice that works best for you. This may include one or more of the following: ? Meditation. Meditation involves focusing your mind on a certain thought or activity. ? Breathing awareness exercises. These help you to stay present by focusing on your breath. ? Body scan. For this practice, you lie down and pay attention to each part of your body from head to toe. You can identify tension and soreness and intentionally relax parts of your body. ? Yoga. Yoga involves stretching and breathing, and it can improve your ability to move and be flexible. It can also provide an experience of testing your body's limits, which can help you release stress. ? Mindful eating. This way of eating involves focusing on the taste, texture, color, and smell of each bite of food. Because this slows down eating and helps you feel full sooner, it can be an important part  of a weight-loss plan.  Find a podcast or recording that provides guidance for breathing awareness, body scan, or meditation exercises. You can listen to these any time when you have a free moment to rest without distractions.  Follow your treatment plan as told by your health care provider. This may  include taking regular medicines and making changes to your diet or lifestyle as recommended. How to practice mindfulness To do a basic awareness exercise:  Find a comfortable place to sit.  Pay attention to the present moment. Observe your thoughts, feelings, and surroundings just as they are.  Avoid placing judgment on yourself, your feelings, or your surroundings. Make note of any judgment that comes up, and let it go.  Your mind may wander, and that is okay. Make note of when your thoughts drift, and return your attention to the present moment. To do basic mindfulness meditation:  Find a comfortable place to sit. This may include a stable chair or a firm floor cushion. ? Sit upright with your back straight. Let your arms fall next to your side with your hands resting on your legs. ? If sitting in a chair, rest your feet flat on the floor. ? If sitting on a cushion, cross your legs in front of you.  Keep your head in a neutral position with your chin dropped slightly. Relax your jaw and rest the tip of your tongue on the roof of your mouth. Drop your gaze to the floor. You can close your eyes if you like.  Breathe normally and pay attention to your breath. Feel the air moving in and out of your nose. Feel your belly expanding and relaxing with each breath.  Your mind may wander, and that is okay. Make note of when your thoughts drift, and return your attention to your breath.  Avoid placing judgment on yourself, your feelings, or your surroundings. Make note of any judgment or feelings that come up, let them go, and bring your attention back to your breath.  When you are ready, lift your gaze or open your eyes. Pay attention to how your body feels after the meditation. Where to find more information You can find more information about MBSR from:  Your health care provider.  Community-based meditation centers or programs.  Programs offered near you. Summary  Mindfulness-based  stress reduction (MBSR) is a program that teaches you how to intentionally pay attention to the present moment. It is used with other treatments to help you cope better with daily stress, emotions, and pain.  MBSR focuses on developing self-awareness, which allows you to respond to life stress without judgment or negative emotions.  MBSR programs may involve learning different mindfulness practices, such as breathing exercises, meditation, yoga, body scan, or mindful eating. Find a mindfulness practice that works best for you, and set aside time for it on a regular basis. This information is not intended to replace advice given to you by your health care provider. Make sure you discuss any questions you have with your health care provider. Document Revised: 02/26/2017 Document Reviewed: 07/23/2016 Elsevier Patient Education  2020 [employer] Venlafaxine extended-release capsules What is this medicine? VENLAFAXINE(VEN la fax een) is used to treat depression, anxiety and panic disorder. This medicine may be used for other purposes; ask your health care provider or pharmacist if you have questions. COMMON BRAND NAME(S): Effexor XR What should I tell my health care provider before I take this medicine? They need to know if you have any  of these conditions:  bleeding disorders  glaucoma  heart disease  high blood pressure  high cholesterol  kidney disease  liver disease  low levels of sodium in the blood  mania or bipolar disorder  seizures  suicidal thoughts, plans, or attempt; a previous suicide attempt by you or a family  take medicines that treat or prevent blood clots  thyroid disease  an unusual or allergic reaction to venlafaxine, desvenlafaxine, other medicines, foods, dyes, or preservatives  pregnant or trying to get pregnant  breast-feeding How should I use this medicine? Take this medicine by mouth with a full glass of water. Follow the directions on the  prescription label. Do not cut, crush, or chew this medicine. Take it with food. If needed, the capsule may be carefully opened and the entire contents sprinkled on a spoonful of cool applesauce. Swallow the applesauce/pellet mixture right away without chewing and follow with a glass of water to ensure complete swallowing of the pellets. Try to take your medicine at about the same time each day. Do not take your medicine more often than directed. Do not stop taking this medicine suddenly except upon the advice of your doctor. Stopping this medicine too quickly may cause serious side effects or your condition may worsen. A special MedGuide will be given to you by the pharmacist with each prescription and refill. Be sure to read this information carefully each time. Talk to your pediatrician regarding the use of this medicine in children. Special care may be needed. Overdosage: If you think you have taken too much of this medicine contact a poison control center or emergency room at once. NOTE: This medicine is only for you. Do not share this medicine with others. What if I miss a dose? If you miss a dose, take it as soon as you can. If it is almost time for your next dose, take only that dose. Do not take double or extra doses. What may interact with this medicine? Do not take this medicine with any of the following medications:  certain medicines for fungal infections like fluconazole, itraconazole, ketoconazole, posaconazole, voriconazole  cisapride  desvenlafaxine  dronedarone  duloxetine  levomilnacipran  linezolid  MAOIs like Carbex, Eldepryl, Marplan, Nardil, and Parnate  methylene blue (injected into a vein)  milnacipran  pimozide  thioridazine This medicine may also interact with the following medications:  amphetamines  aspirin and aspirin-like medicines  certain medicines for depression, anxiety, or psychotic disturbances  certain medicines for migraine headaches  like almotriptan, eletriptan, frovatriptan, naratriptan, rizatriptan, sumatriptan, zolmitriptan  certain medicines for sleep  certain medicines that treat or prevent blood clots like dalteparin, enoxaparin, warfarin  cimetidine  clozapine  diuretics  fentanyl  furazolidone  indinavir  isoniazid  lithium  metoprolol  NSAIDS, medicines for pain and inflammation, like ibuprofen or naproxen  other medicines that prolong the QT interval (cause an abnormal heart rhythm) like dofetilide, ziprasidone  procarbazine  rasagiline  supplements like St. John's wort, kava kava, valerian  tramadol  tryptophan This list may not describe all possible interactions. Give your health care provider a list of all the medicines, herbs, non-prescription drugs, or dietary supplements you use. Also tell them if you smoke, drink alcohol, or use illegal drugs. Some items may interact with your medicine. What should I watch for while using this medicine? Tell your doctor if your symptoms do not get better or if they get worse. Visit your doctor or health care professional for regular checks on your progress.  Because it may take several weeks to see the full effects of this medicine, it is important to continue your treatment as prescribed by your doctor. Patients and their families should watch out for new or worsening thoughts of suicide or depression. Also watch out for sudden changes in feelings such as feeling anxious, agitated, panicky, irritable, hostile, aggressive, impulsive, severely restless, overly excited and hyperactive, or not being able to sleep. If this happens, especially at the beginning of treatment or after a change in dose, call your health care professional. This medicine can cause an increase in blood pressure. Check with your doctor for instructions on monitoring your blood pressure while taking this medicine. You may get drowsy or dizzy. Do not drive, use machinery, or do  anything that needs mental alertness until you know how this medicine affects you. Do not stand or sit up quickly, especially if you are an older patient. This reduces the risk of dizzy or fainting spells. Alcohol may interfere with the effect of this medicine. Avoid alcoholic drinks. Your mouth may get dry. Chewing sugarless gum, sucking hard candy and drinking plenty of water will help. Contact your doctor if the problem does not go away or is severe. What side effects may I notice from receiving this medicine? Side effects that you should report to your doctor or health care professional as soon as possible:  allergic reactions like skin rash, itching or hives, swelling of the face, lips, or tongue  anxious  breathing problems  confusion  changes in vision  chest pain  confusion  elevated mood, decreased need for sleep, racing thoughts, impulsive behavior  eye pain  fast, irregular heartbeat  feeling faint or lightheaded, falls  feeling agitated, angry, or irritable  hallucination, loss of contact with reality  high blood pressure  loss of balance or coordination  palpitations  redness, blistering, peeling or loosening of the skin, including inside the mouth  restlessness, pacing, inability to keep still  seizures  stiff muscles  suicidal thoughts or other mood changes  trouble passing urine or change in the amount of urine  trouble sleeping  unusual bleeding or bruising  unusually weak or tired  vomiting Side effects that usually do not require medical attention (report to your doctor or health care professional if they continue or are bothersome):  change in sex drive or performance  change in appetite or weight  constipation  dizziness  dry mouth  headache  increased sweating  nausea  tired This list may not describe all possible side effects. Call your doctor for medical advice about side effects. You may report side effects to FDA at  1-800-FDA-1088. Where should I keep my medicine? Keep out of the reach of children. Store at a controlled temperature between 20 and 25 degrees C (68 degrees and 77 degrees F), in a dry place. Throw away any unused medicine after the expiration date. NOTE: This sheet is a summary. It may not cover all possible information. If you have questions about this medicine, talk to your doctor, pharmacist, or health care provider.  2020 Elsevier/Gold Standard (2018-03-08 12:06:43) Social Anxiety Disorder, Adult Social anxiety disorder (SAD), previously called social phobia, is a mental health condition. People with SAD often feel nervous, afraid, or embarrassed when they are around other people in social situations. They worry that other people are judging or criticizing them for how they look, what they say, or how they act. SAD involves more than just feeling shy or self-conscious at times.  It can cause severe emotional distress. It can interfere with activities of daily life. SAD also may lead to alcohol or drug use, and even suicide. SAD is a common mental health condition. It can develop at any time, but it usually starts in the teenage years. What are the causes? The cause of this condition is not known. It may involve genes that are passed through families. Stressful events may trigger anxiety. This disorder is also associated with an overactive amygdala. The amygdala is the part of the brain that triggers your response to strong feelings, such as fear. What increases the risk? This condition is more likely to develop in:  People who have a family history of anxiety disorders.  Women.  People who have a physical or behavioral condition that makes them feel self-conscious or nervous, such as a stutter or a long-term (chronic) disease. What are the signs or symptoms? The main symptom of this condition is fear of embarrassment caused by being criticized or judged in social situations. You may be  afraid to:  Speak in public.  Go shopping.  Use a public bathroom.  Eat at a restaurant.  Go to work.  Interact with people you do not know. Extreme fear and anxiety may cause physical symptoms, including:  Blushing.  A fast heartbeat.  Sweating.  Shaky hands or voice.  Confusion.  Light-headedness.  Upset stomach, diarrhea, or vomiting.  Shortness of breath. How is this diagnosed? This condition is diagnosed based on your history, symptoms, and behavior in social situations. You may be diagnosed with this type of anxiety if your symptoms have lasted for more than 6 months and have been present on more days than not. Your health care provider may ask you about your use of alcohol, drugs, and prescription medicines. He or she may also refer you to a mental health specialist for further evaluation or treatment. How is this treated? Treatment for this condition may include:  Cognitive behavioral therapy (CBT). This type of talk therapy helps you learn to replace negative thoughts and behaviors with positive ones. This may include learning how to use self-calming skills and other methods of managing your anxiety.  Exposure therapy. You will be exposed to social situations that cause you fear. The treatment starts with practicing self-calming in situations that cause you low levels of fear. Over time, you will progress by sustaining self-calming and managing harder situations.  Antidepressant medicines. These medicines may be used by themselves or in addition to other therapies.  Biofeedback. This process trains you to manage your body's response (physiological response) through breathing techniques and relaxation methods. You will work with a therapist while machines are used to monitor your physical symptoms.  Techniques for relaxation and managing anxiety. These include deep breathing, self-talk, meditation, visual imagery, muscle relaxation, music therapy, and yoga. These  techniques are often used with other therapies to keep you calm in situations that cause you anxiety. These treatments are often used in combination. Follow these instructions at home: Alcohol use If you drink alcohol:  Limit how much you use to: ? 0-1 drink a day for nonpregnant women. ? 0-2 drinks a day for men.  Be aware of how much alcohol is in your drink. In the [place] one drink equals one 12 oz bottle of beer (355 mL), one 5 oz glass of wine (148 mL), or one 1 oz glass of hard liquor (44 mL). General instructions  Take over-the-counter and prescription medicines only as told by your health  care provider.  Practice techniques for relaxation and managing anxiety at times you are not challenged by social anxiety.  Return to social activities using techniques you have learned, as you feel ready to do so.  Avoid caffeine and certain over-the-counter cold medicines. These may make you feel worse. Ask your pharmacists which medicines to avoid.  Keep all follow-up visits as told by your health care provider. This is important. Where to find more information  The [employer] on Mental Illness (NAMI): https://www.nami.org  Social Anxiety Association: https://socialphobia.org  Mental Health America [facility]): https://www.stevens-henderson.com/  Anxiety and Depression Association of [place] (ADAA): http://miller-hamilton.net/ Contact a health care provider if:  Your symptoms do not improve or get worse.  You have signs of depression, such as: ? Persistent sadness or moodiness. ? Loss of enjoyment in activities that used to bring you joy. ? Change in weight or eating. ? Changes in sleeping habits. ? Avoiding friends or family members more than usual. ? Loss of energy for normal tasks. ? Feeling guilty or worthless.  You become more isolated than you normally are.  You find it more and more difficult to speak or interact with others.  You are using drugs.  You are drinking more alcohol than  usual. Get help right away if:  You harm yourself.  You have suicidal thoughts. If you ever feel like you may hurt yourself or others, or have thoughts about taking your own life, get help right away. You can go to your nearest emergency department or call:  Your local emergency services (911 in the [place]  A suicide crisis helpline, such as the National Suicide Prevention Lifeline at 984-673-2841. This is open 24 hours a day. Summary  Social anxiety disorder (SAD) may cause you to feel nervous, afraid, or embarrassed when you are around other people in social situations.  SAD is a common mental disorder. It can develop at any time, but it usually starts in the teenage years.  Treatment includes talk therapy, exposure therapy, medicines, biofeedback, and relaxation techniques. It can involve a combination of treatments. This information is not intended to replace advice given to you by your health care provider. Make sure you discuss any questions you have with your health care provider. Document Revised: 08/17/2018 Document Reviewed: 08/17/2018 Elsevier Patient Education  2020 [employer].

## 2019-05-24 DIAGNOSIS — E559 Vitamin D deficiency, unspecified: Secondary | ICD-10-CM | POA: Insufficient documentation

## 2019-06-05 ENCOUNTER — Other Ambulatory Visit: Payer: Self-pay

## 2019-06-05 ENCOUNTER — Encounter: Payer: Self-pay | Admitting: Adult Health

## 2019-06-05 ENCOUNTER — Ambulatory Visit (INDEPENDENT_AMBULATORY_CARE_PROVIDER_SITE_OTHER): Payer: 59 | Admitting: Gastroenterology

## 2019-06-05 ENCOUNTER — Encounter: Payer: Self-pay | Admitting: Gastroenterology

## 2019-06-05 VITALS — BP 133/80 | HR 76 | Temp 97.5°F | Ht 61.5 in | Wt 143.5 lb

## 2019-06-05 DIAGNOSIS — Z8719 Personal history of other diseases of the digestive system: Secondary | ICD-10-CM | POA: Diagnosis not present

## 2019-06-05 DIAGNOSIS — K580 Irritable bowel syndrome with diarrhea: Secondary | ICD-10-CM | POA: Diagnosis not present

## 2019-06-05 MED ORDER — DICYCLOMINE HCL 10 MG PO CAPS: 10.0000 mg | ORAL_CAPSULE | Freq: Three times a day (TID) | ORAL | 0 refills | Status: DC

## 2019-06-05 NOTE — Progress Notes (Signed)
Arlyss Repress, MD [address]  [address]  [place], [place] [address]  Main: [phone]  Fax: [phone]    Gastroenterology Consultation  Referring Provider:     Stephanie Acre* Primary Care Physician:  Berniece Pap, FNP Primary Gastroenterologist:  Dr. Arlyss Repress Reason for Consultation:   Abdominal cramps and bowel urgency        HPI:   Yolanda Curtis is a 44 y.o. female referred by Dr. Berniece Pap, FNP  for consultation & management of abdominal cramps and bowel urgency.  Patient reports that she has had history of ischemic colitis about 20 years ago when she had rectal bleeding, attributed to topical acne treatment.  Patient is here today to establish care, she moved from [place] to [place].  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  Patient reports that for last several months she has been experiencing abdominal cramps which she thinks is likely secondary to anxiety whenever she drives car or goes out.  Abdominal cramps are associated with bowel urgency.  She denies epigastric pain, heartburn or regurgitation.  She reports that she may have tried antispasmodic medication over 20 years ago.  She is currently on Effexor, with increase in dose about 2 weeks ago to control her anxiety.  She believes that she can see the difference.  She denies rectal bleeding, abdominal bloating, nausea or vomiting.  She reports having had a colonoscopy in [place] approximately 2017 and she was told that she had some inflammation but no polyps identified. Her weight has been stable, labs including CMP, CBC are unremarkable  NSAIDs: None  Antiplts/Anticoagulants/Anti thrombotics: None  GI Procedures: Colonoscopy in 2017, report not available She does not smoke, drinks 1 to 2 glasses of wine with dinner daily She did not have any GI surgeries  Past Medical History:  Diagnosis Date  . Allergy   . Anxiety   . Depression    . GERD (gastroesophageal reflux disease)   . Vaginal Pap smear, abnormal     Past Surgical History:  Procedure Laterality Date  . AXILLARY HIDRADENITIS EXCISION    . CESAREAN SECTION  x 2  . TUBAL LIGATION      Current Outpatient Medications:  .  Cholecalciferol (VITAMIN D3) 50 MCG (2000 UT) capsule, Take by mouth., Disp: , Rfl:  .  loratadine (CLARITIN) 10 MG tablet, Take 10 mg by mouth daily., Disp: , Rfl:  .  Multiple Vitamin (MULTIVITAMIN) tablet, Take 1 tablet by mouth daily., Disp: , Rfl:  .  nitrofurantoin, macrocrystal-monohydrate, (MACROBID) 100 MG capsule, , Disp: , Rfl:  .  norethindrone-ethinyl estradiol (JUNEL FE 1/20) 1-20 MG-MCG tablet, Take 1 tablet by mouth daily., Disp: 3 Package, Rfl: 3 .  venlafaxine XR (EFFEXOR-XR) 75 MG 24 hr capsule, Take 1 capsule (75 mg total) by mouth daily with breakfast., Disp: 90 capsule, Rfl: 0 .  dicyclomine (BENTYL) 10 MG capsule, Take 1 capsule (10 mg total) by mouth 4 (four) times daily -  before meals and at bedtime for 30 doses., Disp: 30 capsule, Rfl: 0    Family History  Problem Relation Age of Onset  . Hypertension Father   . Hypercholesterolemia Father   . Stroke Father   . Hypercholesterolemia Mother   . Depression Mother   . Hyperlipidemia Mother   . Breast cancer Maternal Aunt 56  . Cancer - Other Maternal Aunt      Social History   Tobacco  Use  . Smoking status: Former Smoker  . Smokeless tobacco: Never Used  Substance Use Topics  . Alcohol use: Yes    Alcohol/week: 10.0 standard drinks    Types: 10 Glasses of wine per week    Comment: 6-10  . Drug use: No    Allergies as of 06/05/2019  . (No Known Allergies)    Review of Systems:    All systems reviewed and negative except where noted in HPI.   Physical Exam:  BP 133/80 (BP Location: Left Arm, Patient Position: Sitting, Cuff Size: Normal)   Pulse 76   Temp (!) 97.5 F (36.4 C) (Oral)   Ht 5' 1.5" (1.562 m)   Wt 143 lb 8 oz (65.1 kg)   BMI  26.68 kg/m  No LMP recorded.  General:   Alert,  Well-developed, well-nourished, pleasant and cooperative in NAD Head:  Normocephalic and atraumatic. Eyes:  Sclera clear, no icterus.   Conjunctiva pink. Ears:  Normal auditory acuity. Nose:  No deformity, discharge, or lesions. Mouth:  No deformity or lesions,oropharynx pink & moist. Neck:  Supple; no masses or thyromegaly. Lungs:  Respirations even and unlabored.  Clear throughout to auscultation.   No wheezes, crackles, or rhonchi. No acute distress. Heart:  Regular rate and rhythm; no murmurs, clicks, rubs, or gallops. Abdomen:  Normal bowel sounds. Soft, non-tender and non-distended without masses, hepatosplenomegaly or hernias noted.  No guarding or rebound tenderness.   Rectal: Not performed Msk:  Symmetrical without gross deformities. Good, equal movement & strength bilaterally. Pulses:  Normal pulses noted. Extremities:  No clubbing or edema.  No cyanosis. Neurologic:  Alert and oriented x3;  grossly normal neurologically. Skin:  Intact without significant lesions or rashes. No jaundice. Psych:  Alert and cooperative. Normal mood and affect.  Imaging Studies: None  Assessment and Plan:   Yolanda Curtis is a 44 y.o. female with history of anxiety, history of ischemic colitis is seen in consultation for chronic abdominal cramps and bowel urgency.  Patient does not have any alarm signs or symptoms at this time.  Based on her history, it appears that her lower GI symptoms are secondary to situational anxiety.  Discussed with her about trial of Bentyl as needed or switch from Effexor to low-dose amitriptyline.  She states she will discuss with her PCP about switching.  She is willing to try Bentyl, prescription sent Also, recommend to check H. pylori breath test as well as celiac disease panel History of ischemic colitis, will obtain copy of the latest colonoscopy report and pathology results from her previous gastroenterologist [place]  Follow up in 2 months   Cephas Darby, MD

## 2019-06-06 ENCOUNTER — Encounter: Payer: Self-pay | Admitting: Adult Health

## 2019-07-20 ENCOUNTER — Encounter: Payer: Self-pay | Admitting: Radiology

## 2019-07-27 ENCOUNTER — Telehealth (INDEPENDENT_AMBULATORY_CARE_PROVIDER_SITE_OTHER): Payer: 59 | Admitting: Adult Health

## 2019-07-27 ENCOUNTER — Encounter: Payer: Self-pay | Admitting: Adult Health

## 2019-07-27 DIAGNOSIS — F4010 Social phobia, unspecified: Secondary | ICD-10-CM | POA: Diagnosis not present

## 2019-07-27 DIAGNOSIS — F411 Generalized anxiety disorder: Secondary | ICD-10-CM

## 2019-07-27 DIAGNOSIS — Z8719 Personal history of other diseases of the digestive system: Secondary | ICD-10-CM | POA: Diagnosis not present

## 2019-07-27 MED ORDER — VENLAFAXINE HCL ER 75 MG PO CP24: 75.0000 mg | ORAL_CAPSULE | Freq: Every day | ORAL | 1 refills | Status: DC

## 2019-07-27 NOTE — Patient Instructions (Addendum)
Social Anxiety Disorder, Adult Social anxiety disorder (SAD), previously called social phobia, is a mental health condition. People with SAD often feel nervous, afraid, or embarrassed when they are around other people in social situations. They worry that other people are judging or criticizing them for how they look, what they say, or how they act. SAD involves more than just feeling shy or self-conscious at times. It can cause severe emotional distress. It can interfere with activities of daily life. SAD also may lead to alcohol or drug use, and even suicide. SAD is a common mental health condition. It can develop at any time, but it usually starts in the teenage years. What are the causes? The cause of this condition is not known. It may involve genes that are passed through families. Stressful events may trigger anxiety. This disorder is also associated with an overactive amygdala. The amygdala is the part of the brain that triggers your response to strong feelings, such as fear. What increases the risk? This condition is more likely to develop in:  People who have a family history of anxiety disorders.  Women.  People who have a physical or behavioral condition that makes them feel self-conscious or nervous, such as a stutter or a long-term (chronic) disease. What are the signs or symptoms? The main symptom of this condition is fear of embarrassment caused by being criticized or judged in social situations. You may be afraid to:  Speak in public.  Go shopping.  Use a public bathroom.  Eat at a restaurant.  Go to work.  Interact with people you do not know. Extreme fear and anxiety may cause physical symptoms, including:  Blushing.  A fast heartbeat.  Sweating.  Shaky hands or voice.  Confusion.  Light-headedness.  Upset stomach, diarrhea, or vomiting.  Shortness of breath. How is this diagnosed? This condition is diagnosed based on your history, symptoms, and  behavior in social situations. You may be diagnosed with this type of anxiety if your symptoms have lasted for more than 6 months and have been present on more days than not. Your health care provider may ask you about your use of alcohol, drugs, and prescription medicines. He or she may also refer you to a mental health specialist for further evaluation or treatment. How is this treated? Treatment for this condition may include:  Cognitive behavioral therapy (CBT). This type of talk therapy helps you learn to replace negative thoughts and behaviors with positive ones. This may include learning how to use self-calming skills and other methods of managing your anxiety.  Exposure therapy. You will be exposed to social situations that cause you fear. The treatment starts with practicing self-calming in situations that cause you low levels of fear. Over time, you will progress by sustaining self-calming and managing harder situations.  Antidepressant medicines. These medicines may be used by themselves or in addition to other therapies.  Biofeedback. This process trains you to manage your body's response (physiological response) through breathing techniques and relaxation methods. You will work with a therapist while machines are used to monitor your physical symptoms.  Techniques for relaxation and managing anxiety. These include deep breathing, self-talk, meditation, visual imagery, muscle relaxation, music therapy, and yoga. These techniques are often used with other therapies to keep you calm in situations that cause you anxiety. These treatments are often used in combination. Follow these instructions at home: Alcohol use If you drink alcohol:  Limit how much you use to: ? 0-1 drink a day for   nonpregnant women. ? 0-2 drinks a day for men.  Be aware of how much alcohol is in your drink. In the [place] one drink equals one 12 oz bottle of beer (355 mL), one 5 oz glass of wine (148 mL), or one 1  oz glass of hard liquor (44 mL). General instructions  Take over-the-counter and prescription medicines only as told by your health care provider.  Practice techniques for relaxation and managing anxiety at times you are not challenged by social anxiety.  Return to social activities using techniques you have learned, as you feel ready to do so.  Avoid caffeine and certain over-the-counter cold medicines. These may make you feel worse. Ask your pharmacists which medicines to avoid.  Keep all follow-up visits as told by your health care provider. This is important. Where to find more information  The [employer] on Mental Illness (NAMI): https://www.nami.org  Social Anxiety Association: https://socialphobia.org  Mental Health America [facility]): https://www.stevens-henderson.com/  Anxiety and Depression Association of [place] (ADAA): http://miller-hamilton.net/ Contact a health care provider if:  Your symptoms do not improve or get worse.  You have signs of depression, such as: ? Persistent sadness or moodiness. ? Loss of enjoyment in activities that used to bring you joy. ? Change in weight or eating. ? Changes in sleeping habits. ? Avoiding friends or family members more than usual. ? Loss of energy for normal tasks. ? Feeling guilty or worthless.  You become more isolated than you normally are.  You find it more and more difficult to speak or interact with others.  You are using drugs.  You are drinking more alcohol than usual. Get help right away if:  You harm yourself.  You have suicidal thoughts. If you ever feel like you may hurt yourself or others, or have thoughts about taking your own life, get help right away. You can go to your nearest emergency department or call:  Your local emergency services (911 in the [place]  A suicide crisis helpline, such as the National Suicide Prevention Lifeline at 860-816-1983. This is open 24 hours a day. Summary  Social anxiety disorder  (SAD) may cause you to feel nervous, afraid, or embarrassed when you are around other people in social situations.  SAD is a common mental disorder. It can develop at any time, but it usually starts in the teenage years.  Treatment includes talk therapy, exposure therapy, medicines, biofeedback, and relaxation techniques. It can involve a combination of treatments. This information is not intended to replace advice given to you by your health care provider. Make sure you discuss any questions you have with your health care provider. Document Revised: 08/17/2018 Document Reviewed: 08/17/2018 Elsevier Patient Education  2020 Elsevier Inc. Venlafaxine extended-release capsules What is this medicine? VENLAFAXINE(VEN la fax een) is used to treat depression, anxiety and panic disorder. This medicine may be used for other purposes; ask your health care provider or pharmacist if you have questions. COMMON BRAND NAME(S): Effexor XR What should I tell my health care provider before I take this medicine? They need to know if you have any of these conditions:  bleeding disorders  glaucoma  heart disease  high blood pressure  high cholesterol  kidney disease  liver disease  low levels of sodium in the blood  mania or bipolar disorder  seizures  suicidal thoughts, plans, or attempt; a previous suicide attempt by you or a family  take medicines that treat or prevent blood clots  thyroid disease  an unusual or allergic  reaction to venlafaxine, desvenlafaxine, other medicines, foods, dyes, or preservatives  pregnant or trying to get pregnant  breast-feeding How should I use this medicine? Take this medicine by mouth with a full glass of water. Follow the directions on the prescription label. Do not cut, crush, or chew this medicine. Take it with food. If needed, the capsule may be carefully opened and the entire contents sprinkled on a spoonful of cool applesauce. Swallow the  applesauce/pellet mixture right away without chewing and follow with a glass of water to ensure complete swallowing of the pellets. Try to take your medicine at about the same time each day. Do not take your medicine more often than directed. Do not stop taking this medicine suddenly except upon the advice of your doctor. Stopping this medicine too quickly may cause serious side effects or your condition may worsen. A special MedGuide will be given to you by the pharmacist with each prescription and refill. Be sure to read this information carefully each time. Talk to your pediatrician regarding the use of this medicine in children. Special care may be needed. Overdosage: If you think you have taken too much of this medicine contact a poison control center or emergency room at once. NOTE: This medicine is only for you. Do not share this medicine with others. What if I miss a dose? If you miss a dose, take it as soon as you can. If it is almost time for your next dose, take only that dose. Do not take double or extra doses. What may interact with this medicine? Do not take this medicine with any of the following medications:  certain medicines for fungal infections like fluconazole, itraconazole, ketoconazole, posaconazole, voriconazole  cisapride  desvenlafaxine  dronedarone  duloxetine  levomilnacipran  linezolid  MAOIs like Carbex, Eldepryl, Marplan, Nardil, and Parnate  methylene blue (injected into a vein)  milnacipran  pimozide  thioridazine This medicine may also interact with the following medications:  amphetamines  aspirin and aspirin-like medicines  certain medicines for depression, anxiety, or psychotic disturbances  certain medicines for migraine headaches like almotriptan, eletriptan, frovatriptan, naratriptan, rizatriptan, sumatriptan, zolmitriptan  certain medicines for sleep  certain medicines that treat or prevent blood clots like dalteparin, enoxaparin,  warfarin  cimetidine  clozapine  diuretics  fentanyl  furazolidone  indinavir  isoniazid  lithium  metoprolol  NSAIDS, medicines for pain and inflammation, like ibuprofen or naproxen  other medicines that prolong the QT interval (cause an abnormal heart rhythm) like dofetilide, ziprasidone  procarbazine  rasagiline  supplements like St. John's wort, kava kava, valerian  tramadol  tryptophan This list may not describe all possible interactions. Give your health care provider a list of all the medicines, herbs, non-prescription drugs, or dietary supplements you use. Also tell them if you smoke, drink alcohol, or use illegal drugs. Some items may interact with your medicine. What should I watch for while using this medicine? Tell your doctor if your symptoms do not get better or if they get worse. Visit your doctor or health care professional for regular checks on your progress. Because it may take several weeks to see the full effects of this medicine, it is important to continue your treatment as prescribed by your doctor. Patients and their families should watch out for new or worsening thoughts of suicide or depression. Also watch out for sudden changes in feelings such as feeling anxious, agitated, panicky, irritable, hostile, aggressive, impulsive, severely restless, overly excited and hyperactive, or not being able to  sleep. If this happens, especially at the beginning of treatment or after a change in dose, call your health care professional. This medicine can cause an increase in blood pressure. Check with your doctor for instructions on monitoring your blood pressure while taking this medicine. You may get drowsy or dizzy. Do not drive, use machinery, or do anything that needs mental alertness until you know how this medicine affects you. Do not stand or sit up quickly, especially if you are an older patient. This reduces the risk of dizzy or fainting spells. Alcohol may  interfere with the effect of this medicine. Avoid alcoholic drinks. Your mouth may get dry. Chewing sugarless gum, sucking hard candy and drinking plenty of water will help. Contact your doctor if the problem does not go away or is severe. What side effects may I notice from receiving this medicine? Side effects that you should report to your doctor or health care professional as soon as possible:  allergic reactions like skin rash, itching or hives, swelling of the face, lips, or tongue  anxious  breathing problems  confusion  changes in vision  chest pain  confusion  elevated mood, decreased need for sleep, racing thoughts, impulsive behavior  eye pain  fast, irregular heartbeat  feeling faint or lightheaded, falls  feeling agitated, angry, or irritable  hallucination, loss of contact with reality  high blood pressure  loss of balance or coordination  palpitations  redness, blistering, peeling or loosening of the skin, including inside the mouth  restlessness, pacing, inability to keep still  seizures  stiff muscles  suicidal thoughts or other mood changes  trouble passing urine or change in the amount of urine  trouble sleeping  unusual bleeding or bruising  unusually weak or tired  vomiting Side effects that usually do not require medical attention (report to your doctor or health care professional if they continue or are bothersome):  change in sex drive or performance  change in appetite or weight  constipation  dizziness  dry mouth  headache  increased sweating  nausea  tired This list may not describe all possible side effects. Call your doctor for medical advice about side effects. You may report side effects to FDA at 1-800-FDA-1088. Where should I keep my medicine? Keep out of the reach of children. Store at a controlled temperature between 20 and 25 degrees C (68 degrees and 77 degrees F), in a dry place. Throw away any unused  medicine after the expiration date. NOTE: This sheet is a summary. It may not cover all possible information. If you have questions about this medicine, talk to your doctor, pharmacist, or health care provider.  2020 Elsevier/Gold Standard (2018-03-08 12:06:43)

## 2019-07-27 NOTE — Progress Notes (Deleted)
  Subjective:     Patient ID: Yolanda Curtis, female   DOB: 1975-06-11, 44 y.o.   MRN: [id number]  HPI  .a  Review of Systems     Objective:   Physical Exam     Assessment:     ***    Plan:     ***

## 2019-07-27 NOTE — Progress Notes (Addendum)
Video My Chart Visit/ Acute Office Visit   I connected with  Yolanda Curtis on 07/27/19 by a video enabled telemedicine application and verified that I am speaking with the correct person using two identifiers.   I discussed the limitations of evaluation and management by telemedicine. The patient expressed understanding and agreed to proceed.  Patient:  is in her home.  Provider : Provider: Provider's office at  [facility], [place].  In office visit is advised if any symptoms worsen or if needed.    ; Subjective:    Patient ID: Yolanda Curtis, female    DOB: 1975/06/14, 44 y.o.   MRN: [id number]  No chief complaint on file.   HPI Patient is in today for  Follow up on social anxiety. She reports she is feeling much better with the increased dosage of Effexor XR to 75 mg daily. She was also given Bentyl for her gastrointestinal issues and reports it has helped as well.  She has less gastrointestinal symptoms now when going out in car or in public.  Denies any suicidal or homicidal ideations or intents.   She reports she is " doing well" she has " some stressors " but reports they are related to her daughter and not herself.   She reports she is not stress eating as she was, and has actualliy lost around 9 lbs which she is happy with.   She has no other concerns at today's visit and wishes to continue current dosage.   Patient  denies any fever, body aches,chills, rash, chest pain, shortness of breath, nausea, vomiting, or diarrhea.  History of tubal ligation.   Past Medical History:  Diagnosis Date  . Allergy   . Anxiety   . Chronic gastritis without bleeding 04/21/2019  . Depression   . GERD (gastroesophageal reflux disease)   . Vaginal Pap smear, abnormal     Past Surgical History:  Procedure Laterality Date  . AXILLARY HIDRADENITIS EXCISION    . CESAREAN SECTION  x 2  . TUBAL LIGATION      Family History  Problem Relation Age of Onset  .  Hypertension Father   . Hypercholesterolemia Father   . Stroke Father   . Hypercholesterolemia Mother   . Depression Mother   . Hyperlipidemia Mother   . Breast cancer Maternal Aunt 56  . Cancer - Other Maternal Aunt     Social History   Socioeconomic History  . Marital status: Single    Spouse name: Not on file  . Number of children: Not on file  . Years of education: Not on file  . Highest education level: Not on file  Occupational History  . Not on file  Tobacco Use  . Smoking status: Former Games developer  . Smokeless tobacco: Never Used  Substance and Sexual Activity  . Alcohol use: Yes    Alcohol/week: 10.0 standard drinks    Types: 10 Glasses of wine per week    Comment: 6-10  . Drug use: No  . Sexual activity: Yes    Birth control/protection: Surgical  Other Topics Concern  . Not on file  Social History Narrative  . Not on file   Social Determinants of Health   Financial Resource Strain:   . Difficulty of Paying Living Expenses:   Food Insecurity:   . Worried About Programme researcher, broadcasting/film/video in the Last Year:   . Barista in the Last Year:   Transportation Needs:   . Lack  of Transportation (Medical):   Marland Kitchen Lack of Transportation (Non-Medical):   Physical Activity:   . Days of Exercise per Week:   . Minutes of Exercise per Session:   Stress:   . Feeling of Stress :   Social Connections:   . Frequency of Communication with Friends and Family:   . Frequency of Social Gatherings with Friends and Family:   . Attends Religious Services:   . Active Member of Clubs or Organizations:   . Attends Banker Meetings:   Marland Kitchen Marital Status:   Intimate Partner Violence:   . Fear of Current or Ex-Partner:   . Emotionally Abused:   Marland Kitchen Physically Abused:   . Sexually Abused:     Outpatient Medications Prior to Visit  Medication Sig Dispense Refill  . Cholecalciferol (VITAMIN D3) 50 MCG (2000 UT) capsule Take by mouth.    . dicyclomine (BENTYL) 10 MG capsule  Take 1 capsule (10 mg total) by mouth 4 (four) times daily -  before meals and at bedtime for 30 doses. 30 capsule 0  . loratadine (CLARITIN) 10 MG tablet Take 10 mg by mouth daily.    . Multiple Vitamin (MULTIVITAMIN) tablet Take 1 tablet by mouth daily.    . nitrofurantoin, macrocrystal-monohydrate, (MACROBID) 100 MG capsule     . norethindrone-ethinyl estradiol (JUNEL FE 1/20) 1-20 MG-MCG tablet Take 1 tablet by mouth daily. 3 Package 3  . venlafaxine XR (EFFEXOR-XR) 75 MG 24 hr capsule Take 1 capsule (75 mg total) by mouth daily with breakfast. 90 capsule 0   No facility-administered medications prior to visit.    No Known Allergies  Review of Systems  Constitutional: Negative.   Respiratory: Negative.   Cardiovascular: Negative.   Genitourinary:       Improving diarrhea since last visit. Follow up with gastrointestinal MD is in a week.   Musculoskeletal: Negative.   Skin: Negative.   Neurological: Negative.   Psychiatric/Behavioral: Positive for sleep disturbance (occasional not sleeping ). Negative for agitation. The patient is nervous/anxious (in social outings mostly much improved on medication, ).        Objective:    Physical Exam Nursing note reviewed.  Constitutional:      Appearance: Normal appearance. She is not ill-appearing.  Psychiatric:        Mood and Affect: Mood normal.        Behavior: Behavior normal.        Thought Content: Thought content normal.        Judgment: Judgment normal.      Video/ audio visit   Patient is alert and oriented and responsive to questions Engages in conversation with provider. Speaks in full sentences without any pauses without any shortness of breath or distress.    There were no vitals taken for this visit. Wt Readings from Last 3 Encounters:  06/05/19 143 lb 8 oz (65.1 kg)  05/23/19 146 lb 9.6 oz (66.5 kg)  04/21/19 149 lb (67.6 kg)    Health Maintenance Due  Topic Date Due  . HIV Screening  Never done  .  COVID-19 Vaccine (1) Never done  . TETANUS/TDAP  Never done    There are no preventive care reminders to display for this patient.   Lab Results  Component Value Date   TSH 1.320 04/28/2019   Lab Results  Component Value Date   WBC 8.3 04/28/2019   HGB 13.7 04/28/2019   HCT 41.3 04/28/2019   MCV 98 (H) 04/28/2019   PLT  314 04/28/2019   Lab Results  Component Value Date   NA 142 04/28/2019   K 4.2 04/28/2019   CO2 18 (L) 04/28/2019   GLUCOSE 86 04/28/2019   BUN 10 04/28/2019   CREATININE 0.79 04/28/2019   BILITOT 0.3 04/28/2019   ALKPHOS 54 04/28/2019   AST 20 04/28/2019   ALT 17 04/28/2019   PROT 7.1 04/28/2019   ALBUMIN 4.7 04/28/2019   CALCIUM 9.4 04/28/2019   ANIONGAP 8 04/14/2015      Assessment & Plan:   Social anxiety disorder  History of ischemic colitis  Generalized anxiety disorder  Meds ordered this encounter  Medications  . venlafaxine XR (EFFEXOR-XR) 75 MG 24 hr capsule    Sig: Take 1 capsule (75 mg total) by mouth daily with breakfast.    Dispense:  90 capsule    Refill:  1    Hold script until patient needs, she is picking up her original refill today. Hold this fill and refill.   Sent in 6 month supply.  After visit summary sent to [facility]. PHQ9 and GAD 7 screenings sent to [facility].   Return in about 6 months (around 01/26/2020), or if symptoms worsen or fail to improve, for at any time for any worsening symptoms, Go to Emergency room/ urgent care if worse.  For follow up on anxiety in 6 months.  She is to have yearly physical and labs.  Follow up as needed.   Advised patient call the office or your primary care doctor for an appointment if no improvement within 72 hours or if any symptoms change or worsen at any time  Advised ER or urgent Care if after hours or on weekend. Call 911 for emergency symptoms at any time.Patinet verbalized understanding of all instructions given/reviewed and treatment plan and has no further questions or  concerns at this time.    Addressed extensive list of chronic and acute medical problems today requiring 20 minutes reviewing her medical record, counseling patient regarding her conditions and coordination of care.     IWellington [place] Lattie Riege, FNP, have reviewed all documentation for this visit. The documentation on 07/27/19 for the exam, diagnosis, procedures, and orders are all accurate and complete.   Marcille Buffy, FNP

## 2019-07-29 ENCOUNTER — Other Ambulatory Visit: Payer: Self-pay | Admitting: Gastroenterology

## 2019-07-29 DIAGNOSIS — K580 Irritable bowel syndrome with diarrhea: Secondary | ICD-10-CM

## 2019-07-31 NOTE — Telephone Encounter (Signed)
Last office visit 06/05/2019 IBS  Last refill 06/05/2019 0 refills  Has appointment 08/01/2019

## 2019-08-01 ENCOUNTER — Telehealth (INDEPENDENT_AMBULATORY_CARE_PROVIDER_SITE_OTHER): Payer: 59 | Admitting: Gastroenterology

## 2019-08-01 ENCOUNTER — Other Ambulatory Visit: Payer: Self-pay

## 2019-08-01 ENCOUNTER — Encounter: Payer: Self-pay | Admitting: Gastroenterology

## 2019-08-01 DIAGNOSIS — K523 Indeterminate colitis: Secondary | ICD-10-CM

## 2019-08-01 MED ORDER — NA SULFATE-K SULFATE-MG SULF 17.5-3.13-1.6 GM/177ML PO SOLN: 354.0000 mL | Freq: Once | ORAL | 0 refills | Status: AC

## 2019-08-01 NOTE — Progress Notes (Signed)
Sherri Sear, MD [address]  [place]  [place], [place] [address]  Main: [phone]  Fax: [phone]    Gastroenterology Consultation Video Visit  Referring Provider:     Sharmon Leyden* Primary Care Physician:  Doreen Beam, FNP Primary Gastroenterologist:  Dr. Cephas Darby Reason for Consultation:     Indeterminate colitis        HPI:   Yolanda Curtis is a 44 y.o. female referred by Dr. Doreen Beam, [place]  for consultation & management of indeterminate colitis  Virtual Visit Video Note  I connected with Jossie Ng on 08/02/19 at  9:00 AM EDT by video and verified that I am speaking with the correct person using two identifiers.   I discussed the limitations, risks, security and privacy concerns of performing an evaluation and management service by video and the availability of in person appointments. I also discussed with the patient that there may be a patient responsible charge related to this service. The patient expressed understanding and agreed to proceed.  Location of the Patient: Home  Location of the provider: Office  Persons participating in the visit: Patient and provider only   History of Present Illness:   Patient reports doing well since last visit.  She is on Bentyl 20 mg as needed with has significantly improved her symptoms of diarrhea, abdominal pain.  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  Patient's Effexor dose has been increased which she thinks is also helping with her GI symptoms  NSAIDs: None  Antiplts/Anticoagulants/Anti thrombotics: None  GI Procedures: Colonoscopy 05/03/2015, biopsies revealed indeterminate colitis  Past Medical History:  Diagnosis Date  . Allergy   . Anxiety   . Chronic gastritis without bleeding 04/21/2019  . Depression   . GERD (gastroesophageal reflux disease)   . Vaginal Pap smear, abnormal     Current  Outpatient Medications:  .  Cholecalciferol (VITAMIN D3) 50 MCG (2000 UT) capsule, Take by mouth., Disp: , Rfl:  .  dicyclomine (BENTYL) 10 MG capsule, TAKE 1 CAPSULE BY MOUTH 4 TIMES DAILY BEFORE  MEALS  AND  AT  BEDTIME  FOR  30  DOSES, Disp: 30 capsule, Rfl: 0 .  loratadine (CLARITIN) 10 MG tablet, Take 10 mg by mouth daily., Disp: , Rfl:  .  Multiple Vitamin (MULTIVITAMIN) tablet, Take 1 tablet by mouth daily., Disp: , Rfl:  .  nitrofurantoin, macrocrystal-monohydrate, (MACROBID) 100 MG capsule, , Disp: , Rfl:  .  norethindrone-ethinyl estradiol (JUNEL FE 1/20) 1-20 MG-MCG tablet, Take 1 tablet by mouth daily., Disp: 3 Package, Rfl: 3 .  venlafaxine XR (EFFEXOR-XR) 75 MG 24 hr capsule, Take 1 capsule (75 mg total) by mouth daily with breakfast., Disp: 90 capsule, Rfl: 1   Family History  Problem Relation Age of Onset  . Hypertension Father   . Hypercholesterolemia Father   . Stroke Father   . Hypercholesterolemia Mother   . Depression Mother   . Hyperlipidemia Mother   . Breast cancer Maternal Aunt 56  . Cancer - Other Maternal Aunt      Social History   Tobacco Use  . Smoking status: Former Research scientist (life sciences)  . Smokeless tobacco: Never Used  Substance Use Topics  . Alcohol use: Yes    Alcohol/week: 10.0 standard drinks    Types: 10 Glasses of wine per week    Comment: 6-10  . Drug use: No    Allergies as of 08/01/2019  . (  No Known Allergies)     Imaging Studies: None  Assessment and Plan:   Yolanda Curtis is a 44 y.o. female with history of anxiety, history of ischemic colitis is seen in consultation for chronic abdominal cramps and bowel urgency.  Her symptoms of abdominal cramps, bowel urgency have currently resolved on Bentyl, higher dose of Effexor.  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  Therefore, recommend colonoscopy with evaluation of terminal ileum and biopsies  I have discussed alternative options, risks & benefits,  which  include, but are not limited to, bleeding, infection, perforation,respiratory complication & drug reaction.  The patient agrees with this plan & written consent will be obtained.      Follow Up Instructions:   I discussed the assessment and treatment plan with the patient. The patient was provided an opportunity to ask questions and all were answered. The patient agreed with the plan and demonstrated an understanding of the instructions.   The patient was advised to call back or seek an in-person evaluation if the symptoms worsen or if the condition fails to improve as anticipated.  I provided 15 minutes of face-to-face time during this encounter.   Follow up in based on colonoscopy results   Arlyss Repress, MD

## 2019-08-21 ENCOUNTER — Other Ambulatory Visit
Admission: RE | Admit: 2019-08-21 | Discharge: 2019-08-21 | Disposition: A | Discharge status: Home / Self Care | Payer: 59 | Source: Ambulatory Visit | Attending: Gastroenterology | Admitting: Gastroenterology

## 2019-08-21 ENCOUNTER — Other Ambulatory Visit: Payer: Self-pay

## 2019-08-21 DIAGNOSIS — Z20822 Contact with and (suspected) exposure to covid-19: Secondary | ICD-10-CM | POA: Diagnosis not present

## 2019-08-21 DIAGNOSIS — Z01812 Encounter for preprocedural laboratory examination: Secondary | ICD-10-CM | POA: Insufficient documentation

## 2019-08-21 LAB — SARS CORONAVIRUS 2 (TAT 6-24 HRS): SARS Coronavirus 2: NEGATIVE

## 2019-08-22 ENCOUNTER — Encounter: Payer: Self-pay | Admitting: Gastroenterology

## 2019-08-23 ENCOUNTER — Encounter: Payer: Self-pay | Admitting: Gastroenterology

## 2019-08-23 ENCOUNTER — Ambulatory Visit
Admission: RE | Admit: 2019-08-23 | Discharge: 2019-08-23 | Disposition: A | Discharge status: Home / Self Care | Payer: 59 | Attending: Gastroenterology | Admitting: Gastroenterology

## 2019-08-23 ENCOUNTER — Encounter
Admission: RE | Disposition: A | Discharge status: Home / Self Care | Payer: Self-pay | Source: Home / Self Care | Attending: Gastroenterology

## 2019-08-23 ENCOUNTER — Other Ambulatory Visit: Payer: Self-pay

## 2019-08-23 ENCOUNTER — Ambulatory Visit: Payer: 59 | Admitting: Certified Registered"

## 2019-08-23 DIAGNOSIS — F329 Major depressive disorder, single episode, unspecified: Secondary | ICD-10-CM | POA: Diagnosis not present

## 2019-08-23 DIAGNOSIS — Z79899 Other long term (current) drug therapy: Secondary | ICD-10-CM | POA: Insufficient documentation

## 2019-08-23 DIAGNOSIS — Z87891 Personal history of nicotine dependence: Secondary | ICD-10-CM | POA: Insufficient documentation

## 2019-08-23 DIAGNOSIS — K523 Indeterminate colitis: Secondary | ICD-10-CM | POA: Diagnosis not present

## 2019-08-23 DIAGNOSIS — K5730 Diverticulosis of large intestine without perforation or abscess without bleeding: Secondary | ICD-10-CM | POA: Insufficient documentation

## 2019-08-23 DIAGNOSIS — F419 Anxiety disorder, unspecified: Secondary | ICD-10-CM | POA: Insufficient documentation

## 2019-08-23 DIAGNOSIS — K219 Gastro-esophageal reflux disease without esophagitis: Secondary | ICD-10-CM | POA: Diagnosis not present

## 2019-08-23 HISTORY — PX: COLONOSCOPY WITH PROPOFOL: SHX5780

## 2019-08-23 HISTORY — DX: Nausea with vomiting, unspecified: Z98.890

## 2019-08-23 HISTORY — DX: Nausea with vomiting, unspecified: R11.2

## 2019-08-23 HISTORY — DX: Other complications of anesthesia, initial encounter: T88.59XA

## 2019-08-23 LAB — CELIAC DISEASE PANEL
Endomysial IgA: NEGATIVE
IgA/Immunoglobulin A, Serum: 128 mg/dL (ref 87–352)
Transglutaminase IgA: <2 U/mL (ref 0–3)

## 2019-08-23 LAB — H. PYLORI BREATH TEST: H pylori Breath Test: NEGATIVE

## 2019-08-23 SURGERY — COLONOSCOPY WITH PROPOFOL
Anesthesia: General

## 2019-08-23 MED ORDER — SODIUM CHLORIDE 0.9 % IV SOLN: INTRAVENOUS | Status: DC

## 2019-08-23 MED ORDER — ONDANSETRON HCL 4 MG/2ML IJ SOLN
INTRAMUSCULAR | Status: DC | PRN
  Administered 2019-08-23: 4 mg via INTRAVENOUS

## 2019-08-23 MED ORDER — MIDAZOLAM HCL 2 MG/2ML IJ SOLN
INTRAMUSCULAR | Status: AC
  Filled 2019-08-23: qty 2

## 2019-08-23 MED ORDER — PROPOFOL 10 MG/ML IV BOLUS
INTRAVENOUS | Status: DC | PRN
  Administered 2019-08-23 (×3): 20 mg via INTRAVENOUS

## 2019-08-23 MED ORDER — PROPOFOL 500 MG/50ML IV EMUL
INTRAVENOUS | Status: DC | PRN
  Administered 2019-08-23: 75 ug/kg/min via INTRAVENOUS

## 2019-08-23 MED ORDER — MIDAZOLAM HCL 2 MG/2ML IJ SOLN
INTRAMUSCULAR | Status: DC | PRN
  Administered 2019-08-23 (×2): 1 mg via INTRAVENOUS

## 2019-08-23 NOTE — H&P (Signed)
Cephas Darby, MD [address]  [place]  [place], [place] [address]  Main: [phone]  Fax: [phone] Pager: [phone]  Primary Care Physician:  Doreen Beam, FNP Primary Gastroenterologist:  Dr. Cephas Darby  Pre-Procedure History & Physical: HPI:  Yolanda Curtis is a 44 y.o. female is here for an colonoscopy.   Past Medical History:  Diagnosis Date  . Allergy   . Anxiety   . Chronic gastritis without bleeding 04/21/2019  . Complication of anesthesia   . Depression   . GERD (gastroesophageal reflux disease)   . PONV (postoperative nausea and vomiting)   . Vaginal Pap smear, abnormal     Past Surgical History:  Procedure Laterality Date  . AXILLARY HIDRADENITIS EXCISION    . CESAREAN SECTION  x 2  . TUBAL LIGATION      Prior to Admission medications   Medication Sig Start Date End Date Taking? Authorizing Provider  norethindrone-ethinyl estradiol (JUNEL FE 1/20) 1-20 MG-MCG tablet Take 1 tablet by mouth daily. 05/02/19  Yes Donnamae Jude, MD  venlafaxine XR (EFFEXOR-XR) 75 MG 24 hr capsule Take 1 capsule (75 mg total) by mouth daily with breakfast. 07/27/19  Yes Flinchum, Kelby Aline, FNP  Cholecalciferol (VITAMIN D3) 50 MCG (2000 UT) capsule Take by mouth.    [provider]  dicyclomine (BENTYL) 10 MG capsule TAKE 1 CAPSULE BY MOUTH 4 TIMES DAILY BEFORE  MEALS  AND  AT  BEDTIME  FOR  30  DOSES 07/31/19   Federick Levene, Tally Due, MD  loratadine (CLARITIN) 10 MG tablet Take 10 mg by mouth daily.    [provider]  Multiple Vitamin (MULTIVITAMIN) tablet Take 1 tablet by mouth daily.    [provider]  nitrofurantoin, macrocrystal-monohydrate, (MACROBID) 100 MG capsule  06/26/18   [provider]    Allergies as of 08/01/2019  . (No Known Allergies)    Family History  Problem Relation Age of Onset  . Hypertension Father   . Hypercholesterolemia Father   . Stroke Father   . Hypercholesterolemia Mother   .  Depression Mother   . Hyperlipidemia Mother   . Breast cancer Maternal Aunt 56  . Cancer - Other Maternal Aunt     Social History   Socioeconomic History  . Marital status: Single    Spouse name: Not on file  . Number of children: Not on file  . Years of education: Not on file  . Highest education level: Not on file  Occupational History  . Not on file  Tobacco Use  . Smoking status: Former Research scientist (life sciences)  . Smokeless tobacco: Never Used  Substance and Sexual Activity  . Alcohol use: Yes    Alcohol/week: 10.0 standard drinks    Types: 10 Glasses of wine per week    Comment: 6-10  . Drug use: No  . Sexual activity: Yes    Birth control/protection: Surgical  Other Topics Concern  . Not on file  Social History Narrative  . Not on file   Social Determinants of Health   Financial Resource Strain:   . Difficulty of Paying Living Expenses:   Food Insecurity:   . Worried About Charity fundraiser in the Last Year:   . Arboriculturist in the Last Year:   Transportation Needs:   . Film/video editor (Medical):   Marland Kitchen Lack of Transportation (Non-Medical):   Physical Activity:   . Days of Exercise per Week:   . Minutes of Exercise  per Session:   Stress:   . Feeling of Stress :   Social Connections:   . Frequency of Communication with Friends and Family:   . Frequency of Social Gatherings with Friends and Family:   . Attends Religious Services:   . Active Member of Clubs or Organizations:   . Attends Banker Meetings:   Marland Kitchen Marital Status:   Intimate Partner Violence:   . Fear of Current or Ex-Partner:   . Emotionally Abused:   Marland Kitchen Physically Abused:   . Sexually Abused:     Review of Systems: See HPI, otherwise negative ROS  Physical Exam: BP 123/80   Pulse 86   Temp (!) 97.3 F (36.3 C) (Temporal)   Resp 16   Ht 5\' 1"  (1.549 m)   Wt 64.4 kg   LMP 08/20/2019   SpO2 100%   BMI 26.83 kg/m  General:   Alert,  pleasant and cooperative in NAD Head:   Normocephalic and atraumatic. Neck:  Supple; no masses or thyromegaly. Lungs:  Clear throughout to auscultation.    Heart:  Regular rate and rhythm. Abdomen:  Soft, nontender and nondistended. Normal bowel sounds, without guarding, and without rebound.   Neurologic:  Alert and  oriented x4;  grossly normal neurologically.  Impression/Plan: Yolanda Curtis is here for an colonoscopy to be performed for indeterminate colitis  Risks, benefits, limitations, and alternatives regarding  colonoscopy have been reviewed with the patient.  Questions have been answered.  All parties agreeable.   Anola Gurney, MD  08/23/2019, 8:43 AM

## 2019-08-23 NOTE — Transfer of Care (Signed)
Immediate Anesthesia Transfer of Care Note  Patient: Yolanda Curtis  Procedure(s) Performed: COLONOSCOPY WITH PROPOFOL (N/A )  Patient Location: PACU and Endoscopy Unit  Anesthesia Type:MAC  Level of Consciousness: awake  Airway & Oxygen Therapy: Patient Spontanous Breathing  Post-op Assessment: Report given to RN  Post vital signs: Reviewed  Last Vitals:  Vitals Value Taken Time  BP 117/62 08/23/19 0920  Temp 36.4 C 08/23/19 0919  Pulse 71 08/23/19 0923  Resp 9 08/23/19 0923  SpO2 100 % 08/23/19 0923  Vitals shown include unvalidated device data.  Last Pain:  Vitals:   08/23/19 0919  TempSrc: Temporal  PainSc: Asleep         Complications: No apparent anesthesia complications

## 2019-08-23 NOTE — Anesthesia Preprocedure Evaluation (Signed)
Anesthesia Evaluation  Patient identified by MRN, date of birth, ID band Patient awake    Reviewed: Allergy & Precautions, NPO status , Patient's Chart, lab work & pertinent test results  History of Anesthesia Complications (+) PONV and history of anesthetic complications  Airway Mallampati: II  TM Distance: >3 FB Neck ROM: Full    Dental  (+) Caps   Pulmonary neg sleep apnea, neg COPD, former smoker,    breath sounds clear to auscultation- rhonchi (-) wheezing      Cardiovascular Exercise Tolerance: Good (-) hypertension(-) CAD, (-) Past MI, (-) Cardiac Stents and (-) CABG  Rhythm:Regular Rate:Normal - Systolic murmurs and - Diastolic murmurs    Neuro/Psych neg Seizures PSYCHIATRIC DISORDERS Anxiety Depression negative neurological ROS     GI/Hepatic Neg liver ROS, GERD  ,  Endo/Other  negative endocrine ROSneg diabetes  Renal/GU negative Renal ROS     Musculoskeletal negative musculoskeletal ROS (+)   Abdominal (+) - obese,   Peds  Hematology negative hematology ROS (+)   Anesthesia Other Findings Past Medical History: No date: Allergy No date: Anxiety 04/21/2019: Chronic gastritis without bleeding No date: Complication of anesthesia No date: Depression No date: GERD (gastroesophageal reflux disease) No date: PONV (postoperative nausea and vomiting) No date: Vaginal Pap smear, abnormal   Reproductive/Obstetrics                             Anesthesia Physical Anesthesia Plan  ASA: II  Anesthesia Plan: General   Post-op Pain Management:    Induction: Intravenous  PONV Risk Score and Plan: 3 and Propofol infusion  Airway Management Planned: Natural Airway  Additional Equipment:   Intra-op Plan:   Post-operative Plan:   Informed Consent: I have reviewed the patients History and Physical, chart, labs and discussed the procedure including the risks, benefits and  alternatives for the proposed anesthesia with the patient or authorized representative who has indicated his/her understanding and acceptance.     Dental advisory given  Plan Discussed with: CRNA and Anesthesiologist  Anesthesia Plan Comments:         Anesthesia Quick Evaluation

## 2019-08-23 NOTE — Op Note (Signed)
[facility] Gastroenterology Patient Name: Yolanda Curtis Procedure Date: 08/23/2019 8:46 AM MRN: [id number] Account #: [id number] Date of Birth: 1975/05/18 Admit Type: Outpatient Age: 44 Room: [facility] ENDO ROOM 4 Gender: Female Note Status: Finalized Procedure:             Colonoscopy Indications:           Suspected ulcerative colitis Providers:             Lin Landsman MD, MD Medicines:             Monitored Anesthesia Care Complications:         No immediate complications. Estimated blood loss: None. Procedure:             Pre-Anesthesia Assessment:                        - Prior to the procedure, a History and Physical was                         performed, and patient medications and allergies were                         reviewed. The patient is competent. The risks and                         benefits of the procedure and the sedation options and                         risks were discussed with the patient. All questions                         were answered and informed consent was obtained.                         Patient identification and proposed procedure were                         verified by the physician, the nurse, the                         anesthesiologist, the anesthetist and the technician                         in the pre-procedure area in the procedure room in the                         endoscopy suite. Mental Status Examination: alert and                         oriented. Airway Examination: normal oropharyngeal                         airway and neck mobility. Respiratory Examination:                         clear to auscultation. CV Examination: normal.                         Prophylactic Antibiotics: The patient does not require  prophylactic antibiotics. Prior Anticoagulants: The                         patient has taken no previous anticoagulant or                         antiplatelet agents. ASA Grade  Assessment: II - A                         patient with mild systemic disease. After reviewing                         the risks and benefits, the patient was deemed in                         satisfactory condition to undergo the procedure. The                         anesthesia plan was to use monitored anesthesia care                         (MAC). Immediately prior to administration of                         medications, the patient was re-assessed for adequacy                         to receive sedatives. The heart rate, respiratory                         rate, oxygen saturations, blood pressure, adequacy of                         pulmonary ventilation, and response to care were                         monitored throughout the procedure. The physical                         status of the patient was re-assessed after the                         procedure.                        After obtaining informed consent, the colonoscope was                         passed under direct vision. Throughout the procedure,                         the patient's blood pressure, pulse, and oxygen                         saturations were monitored continuously. The                         Colonoscope was introduced through the anus and  advanced to the 20 cm into the ileum. The colonoscopy                         was performed without difficulty. The patient                         tolerated the procedure well. The quality of the bowel                         preparation was evaluated using the BBPS [facility] Bowel                         Preparation Scale) with scores of: Right Colon = 3,                         Transverse Colon = 3 and Left Colon = 3 (entire mucosa                         seen well with no residual staining, small fragments                         of stool or opaque liquid). The total BBPS score                         equals 9. Findings:      The perianal and  digital rectal examinations were normal. Pertinent       negatives include normal sphincter tone and no palpable rectal lesions.      The terminal ileum appeared normal.      The colon (entire examined portion) appeared normal. Biopsies were taken       with a cold forceps for histology.      The retroflexed view of the distal rectum and anal verge was normal and       showed no anal or rectal abnormalities.      A few diverticula were found in the sigmoid colon and descending colon. Impression:            - The examined portion of the ileum was normal.                        - The entire examined colon is normal. Biopsied.                        - The distal rectum and anal verge are normal on                         retroflexion view.                        - Diverticulosis in the sigmoid colon and in the                         descending colon. Recommendation:        - Discharge patient to home (with escort).                        - Resume previous diet today.                        -  Continue present medications.                        - Await pathology results.                        - Return to my office as previously scheduled. Procedure Code(s):     --- Professional ---                        [phone], Colonoscopy, flexible; with biopsy, single or                         multiple Diagnosis Code(s):     --- Professional ---                        K57.30, Diverticulosis of large intestine without                         perforation or abscess without bleeding CPT copyright 2019 American Medical Association. All rights reserved. The codes documented in this report are preliminary and upon coder review may  be revised to meet current compliance requirements. Dr. Ulyess Mort Lin Landsman MD, MD 08/23/2019 9:17:09 AM This report has been signed electronically. Number of Addenda: 0 Note Initiated On: 08/23/2019 8:46 AM Scope Withdrawal Time: 0 hours 7 minutes 36 seconds  Total  Procedure Duration: 0 hours 10 minutes 45 seconds  Estimated Blood Loss:  Estimated blood loss: none. Estimated blood loss: none.      [facility]

## 2019-08-23 NOTE — Anesthesia Postprocedure Evaluation (Signed)
Anesthesia Post Note  Patient: Yolanda Curtis  Procedure(s) Performed: COLONOSCOPY WITH PROPOFOL (N/A )  Patient location during evaluation: Endoscopy Anesthesia Type: General Level of consciousness: awake and alert and oriented Pain management: pain level controlled Vital Signs Assessment: post-procedure vital signs reviewed and stable Respiratory status: spontaneous breathing, nonlabored ventilation and respiratory function stable Cardiovascular status: blood pressure returned to baseline and stable Postop Assessment: no signs of nausea or vomiting Anesthetic complications: no     Last Vitals:  Vitals:   08/23/19 0939 08/23/19 0949  BP: 127/74 118/67  Pulse: 72 69  Resp: 16 13  Temp:    SpO2: 100% 100%    Last Pain:  Vitals:   08/23/19 0949  TempSrc:   PainSc: 0-No pain                 Brylee Mcgreal

## 2019-08-24 LAB — SURGICAL PATHOLOGY

## 2019-08-25 ENCOUNTER — Encounter: Payer: Self-pay | Admitting: *Deleted

## 2019-08-25 NOTE — Progress Notes (Signed)
  Surgical pathology from colonoscopy, keep follow ups as advised by your gastrointestinal MD. No malignancy or active inflammation visualized.  DIAGNOSIS:  A. COLON; RANDOM COLD BIOPSY:  - COLONIC MUCOSA WITH INTACT CRYPT ARCHITECTURE.  - NEGATIVE FOR ACTIVE INFLAMMATION, MICROSCOPIC COLITIS, DYSPLASIA, AND  MALIGNANCY.

## 2019-09-05 ENCOUNTER — Other Ambulatory Visit: Payer: Self-pay | Admitting: Adult Health

## 2019-09-05 NOTE — Telephone Encounter (Signed)
Per OV- fill until next visit due- 10/21- patient does not have labs on file- review to add at next visit.

## 2019-10-13 ENCOUNTER — Other Ambulatory Visit: Payer: Self-pay | Admitting: Gastroenterology

## 2019-10-13 DIAGNOSIS — K580 Irritable bowel syndrome with diarrhea: Secondary | ICD-10-CM

## 2019-10-17 ENCOUNTER — Ambulatory Visit: Payer: 59 | Admitting: Obstetrics & Gynecology

## 2019-11-07 ENCOUNTER — Ambulatory Visit (INDEPENDENT_AMBULATORY_CARE_PROVIDER_SITE_OTHER): Payer: 59 | Admitting: Obstetrics & Gynecology

## 2019-11-07 ENCOUNTER — Other Ambulatory Visit: Payer: Self-pay

## 2019-11-07 VITALS — BP 117/70 | HR 69 | Wt 151.8 lb

## 2019-11-07 DIAGNOSIS — Z01419 Encounter for gynecological examination (general) (routine) without abnormal findings: Secondary | ICD-10-CM | POA: Diagnosis not present

## 2019-11-07 DIAGNOSIS — Z3009 Encounter for other general counseling and advice on contraception: Secondary | ICD-10-CM

## 2019-11-07 NOTE — Progress Notes (Signed)
GYNECOLOGY ANNUAL PREVENTATIVE CARE ENCOUNTER NOTE  History:     Yolanda Curtis is a 44 y.o. G18P2002 female here for a routine annual gynecologic exam.  Current complaints: wants longer acting birth control as she forgets to take pills at the same time and gets side effects.   Denies abnormal vaginal bleeding, discharge, pelvic pain, problems with intercourse or other gynecologic concerns.    Gynecologic History Patient's last menstrual period was 10/16/2019 (lmp unknown). Contraception: OCP (estrogen/progesterone) Last Pap: 08/23/2018. Results were: normal with negative HPV Last mammogram: 03/06/2019. Results were: normal  Obstetric History OB History  Gravida Para Term Preterm AB Living  2 2 2     2   SAB TAB Ectopic Multiple Live Births          2    # Outcome Date GA Lbr Len/2nd Weight Sex Delivery Anes PTL Lv  2 Term 08/30/13    M CS-LTranv   LIV  1 Term 08/20/04    F CS-LTranv   LIV    Past Medical History:  Diagnosis Date  . Allergy   . Anxiety   . Chronic gastritis without bleeding 04/21/2019  . Complication of anesthesia   . Depression   . GERD (gastroesophageal reflux disease)   . PONV (postoperative nausea and vomiting)   . Vaginal Pap smear, abnormal     Past Surgical History:  Procedure Laterality Date  . AXILLARY HIDRADENITIS EXCISION    . CESAREAN SECTION  x 2  . COLONOSCOPY WITH PROPOFOL N/A 08/23/2019   Procedure: COLONOSCOPY WITH PROPOFOL;  Surgeon: 08/25/2019, MD;  Location: [facility] ENDOSCOPY;  Service: Gastroenterology;  Laterality: N/A;  . TUBAL LIGATION      Current Outpatient Medications on File Prior to Visit  Medication Sig Dispense Refill  . Cholecalciferol (VITAMIN D3) 50 MCG (2000 UT) capsule Take by mouth.    . dicyclomine (BENTYL) 10 MG capsule TAKE 1 CAPSULE BY MOUTH 4 TIMES DAILY BEFORE  MEALS  AND  AT  BEDTIME  FOR  30  DOSES 30 capsule 0  . loratadine (CLARITIN) 10 MG tablet Take 10 mg by mouth daily.    . Multiple Vitamin  (MULTIVITAMIN) tablet Take 1 tablet by mouth daily.    . norethindrone-ethinyl estradiol (JUNEL FE 1/20) 1-20 MG-MCG tablet Take 1 tablet by mouth daily. 3 Package 3  . venlafaxine XR (EFFEXOR-XR) 75 MG 24 hr capsule TAKE 1 CAPSULE BY MOUTH ONCE DAILY WITH  BREAKFAST 90 capsule 0  . nitrofurantoin, macrocrystal-monohydrate, (MACROBID) 100 MG capsule  (Patient not taking: Reported on 11/07/2019)     No current facility-administered medications on file prior to visit.    No Known Allergies  Social History:  reports that she has quit smoking. She has never used smokeless tobacco. She reports current alcohol use of about 10.0 standard drinks of alcohol per week. She reports that she does not use drugs.  Family History  Problem Relation Age of Onset  . Hypertension Father   . Hypercholesterolemia Father   . Stroke Father   . Hypercholesterolemia Mother   . Depression Mother   . Hyperlipidemia Mother   . Breast cancer Maternal Aunt 56  . Cancer - Other Maternal Aunt     The following portions of the patient's history were reviewed and updated as appropriate: allergies, current medications, past family history, past medical history, past social history, past surgical history and problem list.  Review of Systems Pertinent items noted in HPI and remainder of comprehensive ROS  otherwise negative.  Physical Exam:  BP 117/70   Pulse 69   Wt 151 lb 12.8 oz (68.9 kg)   LMP 10/16/2019 (LMP Unknown)   BMI 28.68 kg/m  CONSTITUTIONAL: Well-developed, well-nourished female in no acute distress.  HENT:  Normocephalic, atraumatic, External right and left ear normal. Oropharynx is clear and moist EYES: Conjunctivae and EOM are normal. Pupils are equal, round, and reactive to light. No scleral icterus.  NECK: Normal range of motion, supple, no masses.  Normal thyroid.  SKIN: Skin is warm and dry. No rash noted. Not diaphoretic. No erythema. No pallor. MUSCULOSKELETAL: Normal range of motion. No  tenderness.  No cyanosis, clubbing, or edema.  2+ distal pulses. NEUROLOGIC: Alert and oriented to person, place, and time. Normal reflexes, muscle tone coordination.  PSYCHIATRIC: Normal mood and affect. Normal behavior. Normal judgment and thought content. CARDIOVASCULAR: Normal heart rate noted, regular rhythm RESPIRATORY: Clear to auscultation bilaterally. Effort and breath sounds normal, no problems with respiration noted. BREASTS:Deferred by patient.  ABDOMEN: Soft, no distention noted.  No tenderness, rebound or guarding.  PELVIC: Deferred by patient.    Assessment and Plan:    1. Birth control counseling Discussed patch vs Nuvaring, also discussed LARCs.  She is leaning towards patch for now, Xulane samples given to her.  Cautioned about FDA black box warning about increased VTE risk.   2. Well woman exam Up to date for pap and mammogram.  Routine preventative health maintenance measures emphasized. Please refer to After Visit Summary for other counseling recommendations.      Jaynie Collins, MD, FACOG Obstetrician & Gynecologist, [facility] for [employer], [facility], [facility]

## 2019-11-07 NOTE — Patient Instructions (Signed)

## 2019-12-09 ENCOUNTER — Other Ambulatory Visit: Payer: Self-pay | Admitting: Gastroenterology

## 2019-12-09 DIAGNOSIS — K580 Irritable bowel syndrome with diarrhea: Secondary | ICD-10-CM

## 2019-12-11 ENCOUNTER — Other Ambulatory Visit: Payer: Self-pay | Admitting: Adult Health

## 2019-12-11 ENCOUNTER — Encounter: Payer: Self-pay | Admitting: Radiology

## 2020-01-26 ENCOUNTER — Telehealth: Payer: 59 | Admitting: Adult Health

## 2020-01-30 ENCOUNTER — Telehealth (INDEPENDENT_AMBULATORY_CARE_PROVIDER_SITE_OTHER): Payer: 59 | Admitting: Adult Health

## 2020-01-30 ENCOUNTER — Encounter: Payer: Self-pay | Admitting: Adult Health

## 2020-01-30 DIAGNOSIS — E559 Vitamin D deficiency, unspecified: Secondary | ICD-10-CM | POA: Diagnosis not present

## 2020-01-30 DIAGNOSIS — R5383 Other fatigue: Secondary | ICD-10-CM | POA: Diagnosis not present

## 2020-01-30 MED ORDER — VENLAFAXINE HCL ER 75 MG PO CP24: ORAL_CAPSULE | ORAL | 2 refills | Status: DC

## 2020-01-30 NOTE — Progress Notes (Signed)
MyChart Video Visit    Virtual Visit via Video Note   This visit type was conducted due to national recommendations for restrictions regarding the COVID-19 Pandemic (e.g. social distancing) in an effort to limit this patient's exposure and mitigate transmission in our community. This patient is at least at moderate risk for complications without adequate follow up. This format is felt to be most appropriate for this patient at this time. Physical exam was limited by quality of the video and audio technology used for the visit.   Patient location: at her home     Provider location: Provider: Provider's office at  [facility], [place].     I discussed the limitations of evaluation and management by telemedicine and the availability of in person appointments. The patient expressed understanding and agreed to proceed.  Patient: Yolanda Curtis   DOB: 01-29-1976   44 y.o. Female  MRN: [id number] Visit Date: 01/30/2020  Today's healthcare provider: Jairo Ben, FNP   Chief Complaint  Patient presents with  . Anxiety   Subjective    HPI  Social Anxiety Disorder, Follow-up  She was last seen for anxiety 9 months ago. Changes made at last visit include patient was increased on Effexor-XR from 37.5mg  to 75mg  .  Symptoms have resolved. Stomach issues have resolved most of the time.  She reports excellent compliance with treatment. She reports good tolerance of treatment. She is not having side effects.   She does have fatigue, this is not new, she is taking vitamin D will recheck level. She attributes to being busy and multitasking all the time.  Feels the Effexor dose change has really helped since last visit.  She feels her anxiety is mild and Unchanged since last visit.   Patient  denies any fever, body aches,chills, rash, chest pain, shortness of breath, nausea, vomiting, or diarrhea.  Denies dizziness, lightheadedness, pre syncopal or syncopal  episodes.   Symptoms: No chest pain No difficulty concentrating  No dizziness Yes fatigue  No feelings of losing control No insomnia  Yes irritable No palpitations  No panic attacks No racing thoughts  No shortness of breath No sweating  No tremors/shakes    GAD-7 Results GAD-7 Generalized Anxiety Disorder Screening Tool 08/01/2019 04/21/2019  1. Feeling Nervous, Anxious, or on Edge 0 2  2. Not Being Able to Stop or Control Worrying 0 1  3. Worrying Too Much About Different Things 0 2  4. Trouble Relaxing 1 3  5. Being So Restless it's Hard To Sit Still 0 2  6. Becoming Easily Annoyed or Irritable 1 2  7. Feeling Afraid As If Something Awful Might Happen 0 0  Total GAD-7 Score 2 12  Difficulty At Work, Home, or Getting  Along With Others? Somewhat difficult -    PHQ-9 Scores PHQ9 SCORE ONLY 08/01/2019 04/21/2019  PHQ-9 Total Score 0 9    ---------------------------------------------------------------------------------------------------  Patient Active Problem List   Diagnosis Date Noted  . Indeterminate colitis   . Generalized anxiety disorder 04/21/2019  . Depression 04/21/2019  . Social anxiety disorder 04/21/2019  . Fatigue 04/21/2019  . History of ischemic colitis 04/21/2019  . Polyphagia 04/21/2019  . Screening for blood or protein in urine 04/21/2019  . Frequent UTI 08/23/2018  . Bacterial vaginosis 08/23/2018  . HSV (herpes simplex virus) anogenital infection 08/23/2018  . PMDD (premenstrual dysphoric disorder) 08/23/2018   Past Medical History:  Diagnosis Date  . Allergy   . Anxiety   . Chronic  gastritis without bleeding 04/21/2019  . Complication of anesthesia   . Depression   . GERD (gastroesophageal reflux disease)   . PONV (postoperative nausea and vomiting)   . Vaginal Pap smear, abnormal    Past Surgical History:  Procedure Laterality Date  . AXILLARY HIDRADENITIS EXCISION    . CESAREAN SECTION  x 2  . COLONOSCOPY WITH PROPOFOL N/A 08/23/2019    Procedure: COLONOSCOPY WITH PROPOFOL;  Surgeon: Toney ReilVanga, Rohini Reddy, MD;  [facility];  Service: Gastroenterology;  Laterality: N/A;  . TUBAL LIGATION     Social History   Tobacco Use  . Smoking status: Former Games developermoker  . Smokeless tobacco: Never Used  Vaping Use  . Vaping Use: Never used  Substance Use Topics  . Alcohol use: Yes    Alcohol/week: 10.0 standard drinks    Types: 10 Glasses of wine per week    Comment: 6-10  . Drug use: No   Social History   Socioeconomic History  . Marital status: Single    Spouse name: Not on file  . Number of children: Not on file  . Years of education: Not on file  . Highest education level: Not on file  Occupational History  . Not on file  Tobacco Use  . Smoking status: Former Games developermoker  . Smokeless tobacco: Never Used  Vaping Use  . Vaping Use: Never used  Substance and Sexual Activity  . Alcohol use: Yes    Alcohol/week: 10.0 standard drinks    Types: 10 Glasses of wine per week    Comment: 6-10  . Drug use: No  . Sexual activity: Yes    Birth control/protection: Surgical  Other Topics Concern  . Not on file  Social History Narrative  . Not on file   Social Determinants of Health   Financial Resource Strain:   . Difficulty of Paying Living Expenses: Not on file  Food Insecurity:   . Worried About Programme researcher, broadcasting/film/videounning Out of Food in the Last Year: Not on file  . Ran Out of Food in the Last Year: Not on file  Transportation Needs:   . Lack of Transportation (Medical): Not on file  . Lack of Transportation (Non-Medical): Not on file  Physical Activity:   . Days of Exercise per Week: Not on file  . Minutes of Exercise per Session: Not on file  Stress:   . Feeling of Stress : Not on file  Social Connections:   . Frequency of Communication with Friends and Family: Not on file  . Frequency of Social Gatherings with Friends and Family: Not on file  . Attends Religious Services: Not on file  . Active Member of Clubs or  Organizations: Not on file  . Attends BankerClub or Organization Meetings: Not on file  . Marital Status: Not on file  Intimate Partner Violence:   . Fear of Current or Ex-Partner: Not on file  . Emotionally Abused: Not on file  . Physically Abused: Not on file  . Sexually Abused: Not on file   Family Status  Relation Name Status  . Father  (Not Specified)  . Mother  (Not Specified)  . Mat Aunt  (Not Specified)   Family History  Problem Relation Age of Onset  . Hypertension Father   . Hypercholesterolemia Father   . Stroke Father   . Hypercholesterolemia Mother   . Depression Mother   . Hyperlipidemia Mother   . Breast cancer Maternal Aunt 56  . Cancer - Other Maternal Aunt  No Known Allergies    Medications: Outpatient Medications Prior to Visit  Medication Sig  . Cholecalciferol (VITAMIN D3) 50 MCG (2000 UT) capsule Take by mouth.  . dicyclomine (BENTYL) 10 MG capsule TAKE 1 CAPSULE BY MOUTH 4 TIMES DAILY BEFORE  MEALS  AND  AT  BEDTIME  FOR  30  DOSES  . loratadine (CLARITIN) 10 MG tablet Take 10 mg by mouth daily.  . Multiple Vitamin (MULTIVITAMIN) tablet Take 1 tablet by mouth daily.  . norethindrone-ethinyl estradiol (JUNEL FE 1/20) 1-20 MG-MCG tablet Take 1 tablet by mouth daily.  . [DISCONTINUED] nitrofurantoin, macrocrystal-monohydrate, (MACROBID) 100 MG capsule  (Patient not taking: Reported on 11/07/2019)  . [DISCONTINUED] venlafaxine XR (EFFEXOR-XR) 75 MG 24 hr capsule TAKE 1 CAPSULE BY MOUTH ONCE DAILY WITH BREAKFAST   No facility-administered medications prior to visit.    Review of Systems  Constitutional: Positive for fatigue. Negative for activity change, appetite change, chills, diaphoresis, fever and unexpected weight change.  HENT: Negative.   Respiratory: Negative.   Cardiovascular: Negative.   Gastrointestinal: Negative.   Genitourinary: Negative.   Musculoskeletal: Negative.   Neurological: Negative.   Hematological: Negative.     Psychiatric/Behavioral: Negative.     Last CBC Lab Results  Component Value Date   WBC 8.3 04/28/2019   HGB 13.7 04/28/2019   HCT 41.3 04/28/2019   MCV 98 (H) 04/28/2019   MCH 32.4 04/28/2019   RDW 12.0 04/28/2019   PLT 314 04/28/2019   Last metabolic panel Lab Results  Component Value Date   GLUCOSE 86 04/28/2019   NA 142 04/28/2019   K 4.2 04/28/2019   CL 103 04/28/2019   CO2 18 (L) 04/28/2019   BUN 10 04/28/2019   CREATININE 0.79 04/28/2019   GFRNONAA 93 04/28/2019   GFRAA 108 04/28/2019   CALCIUM 9.4 04/28/2019   PROT 7.1 04/28/2019   ALBUMIN 4.7 04/28/2019   LABGLOB 2.4 04/28/2019   AGRATIO 2.0 04/28/2019   BILITOT 0.3 04/28/2019   ALKPHOS 54 04/28/2019   AST 20 04/28/2019   ALT 17 04/28/2019   ANIONGAP 8 04/14/2015   Last lipids No results found for: CHOL, HDL, LDLCALC, LDLDIRECT, TRIG, CHOLHDL Last hemoglobin A1c No results found for: HGBA1C Last thyroid functions Lab Results  Component Value Date   TSH 1.320 04/28/2019   Last vitamin D Lab Results  Component Value Date   VD25OH 38.8 04/28/2019   Last vitamin B12 and Folate No results found for: VITAMINB12, FOLATE  Objective    There were no vitals taken for this visit. BP Readings from Last 3 Encounters:  11/07/19 117/70  08/23/19 118/67  06/05/19 133/80   Wt Readings from Last 3 Encounters:  11/07/19 151 lb 12.8 oz (68.9 kg)  08/23/19 142 lb (64.4 kg)  06/05/19 143 lb 8 oz (65.1 kg)      Physical Exam Constitutional:      General: She is not in acute distress.    Appearance: Normal appearance. She is not ill-appearing, toxic-appearing or diaphoretic.  Neurological:     Mental Status: She is alert and oriented to person, place, and time.  Psychiatric:        Mood and Affect: Mood normal.        Behavior: Behavior normal.        Thought Content: Thought content normal.        Judgment: Judgment normal.      Patient is alert and oriented and responsive to questions Engages in  conversation with provider. Speaks  in full sentences without any pauses without any shortness of breath or distress.     Assessment & Plan     Vitamin D insufficiency - Plan: VITAMIN D 25 Hydroxy (Vit-D Deficiency, Fractures)  Fatigue, unspecified type - Plan: CBC with Differential/Platelet, Comprehensive Metabolic Panel (CMET), TSH  Orders Placed This Encounter  Procedures  . VITAMIN D 25 Hydroxy (Vit-D Deficiency, Fractures)  . CBC with Differential/Platelet  . Comprehensive Metabolic Panel (CMET)  . TSH  labs ordered can walk in.   Meds ordered this encounter  Medications  . venlafaxine XR (EFFEXOR-XR) 75 MG 24 hr capsule    Sig: TAKE 1 CAPSULE BY MOUTH ONCE DAILY WITH BREAKFAST    Dispense:  90 capsule    Refill:  2   Sent to [facility].    Discussed known black box warning for anti depression/ anxiety medication. Need to report any behavioral changes right, if any homicidal or suicidal thoughts or ideas seek medical attention right away. Call 911.   Return in about 3 months (around 05/01/2020), or if symptoms worsen or fail to improve, for at any time for any worsening symptoms.     I discussed the assessment and treatment plan with the patient. The patient was provided an opportunity to ask questions and all were answered. The patient agreed with the plan and demonstrated an understanding of the instructions.   The patient was advised to call back or seek an in-person evaluation if the symptoms worsen or if the condition fails to improve as anticipated.  I provided 20  minutes of non-face-to-face time during this encounter.  Follow up for CPE after 04/20/20 and as needed.   Jairo Ben, FNP [facility] [phone] (phone) [phone] (fax)  [facility]

## 2020-01-30 NOTE — Patient Instructions (Signed)
Meds ordered this encounter  Medications  . venlafaxine XR (EFFEXOR-XR) 75 MG 24 hr capsule    Sig: TAKE 1 CAPSULE BY MOUTH ONCE DAILY WITH BREAKFAST    Dispense:  90 capsule    Refill:  2  [facility].  Orders Placed This Encounter  Procedures  . VITAMIN D 25 Hydroxy (Vit-D Deficiency, Fractures)  . CBC with Differential/Platelet  . Comprehensive Metabolic Panel (CMET)  . TSH   Labs ordered as above ok to walk into lab.      Fatigue If you have fatigue, you feel tired all the time and have a lack of energy or a lack of motivation. Fatigue may make it difficult to start or complete tasks because of exhaustion. In general, occasional or mild fatigue is often a normal response to activity or life. However, long-lasting (chronic) or extreme fatigue may be a symptom of a medical condition. Follow these instructions at home: General instructions  Watch your fatigue for any changes.  Go to bed and get up at the same time every day.  Avoid fatigue by pacing yourself during the day and getting enough sleep at night.  Maintain a healthy weight. Medicines  Take over-the-counter and prescription medicines only as told by your health care provider.  Take a multivitamin, if told by your health care provider.  Do not use herbal or dietary supplements unless they are approved by your health care provider. Activity   Exercise regularly, as told by your health care provider.  Use or practice techniques to help you relax, such as yoga, tai chi, meditation, or massage therapy. Eating and drinking   Avoid heavy meals in the evening.  Eat a well-balanced diet, which includes lean proteins, whole grains, plenty of fruits and vegetables, and low-fat dairy products.  Avoid consuming too much caffeine.  Avoid the use of alcohol.  Drink enough fluid to keep your urine pale yellow. Lifestyle  Change situations that cause you stress. Try to keep your work and personal  schedule in balance.  Do not use any products that contain nicotine or tobacco, such as cigarettes and e-cigarettes. If you need help quitting, ask your health care provider.  Do not use drugs. Contact a health care provider if:  Your fatigue does not get better.  You have a fever.  You suddenly lose or gain weight.  You have headaches.  You have trouble falling asleep or sleeping through the night.  You feel angry, guilty, anxious, or sad.  You are unable to have a bowel movement (constipation).  Your skin is dry.  You have swelling in your legs or another part of your body. Get help right away if:  You feel confused.  Your vision is blurry.  You feel faint or you pass out.  You have a severe headache.  You have severe pain in your abdomen, your back, or the area between your waist and hips (pelvis).  You have chest pain, shortness of breath, or an irregular or fast heartbeat.  You are unable to urinate, or you urinate less than normal.  You have abnormal bleeding, such as bleeding from the rectum, vagina, nose, lungs, or nipples.  You vomit blood.  You have thoughts about hurting yourself or others. If you ever feel like you may hurt yourself or others, or have thoughts about taking your own life, get help right away. You can go to your nearest emergency department or call:  Your local emergency services (911 in the  [place]  A suicide crisis helpline, such as the [place] at (646) 442-6700. This is open 24 hours a day. Summary  If you have fatigue, you feel tired all the time and have a lack of energy or a lack of motivation.  Fatigue may make it difficult to start or complete tasks because of exhaustion.  Long-lasting (chronic) or extreme fatigue may be a symptom of a medical condition.  Exercise regularly, as told by your health care provider.  Change situations that cause you stress. Try to keep your work and personal  schedule in balance. This information is not intended to replace advice given to you by your health care provider. Make sure you discuss any questions you have with your health care provider. Document Revised: 10/05/2018 Document Reviewed: 12/09/2016 Elsevier Patient Education  [place].  Generalized Anxiety Disorder, Adult Generalized anxiety disorder (GAD) is a mental health disorder. People with this condition constantly worry about everyday events. Unlike normal anxiety, worry related to GAD is not triggered by a specific event. These worries also do not fade or get better with time. GAD interferes with life functions, including relationships, work, and school. GAD can vary from mild to severe. People with severe GAD can have intense waves of anxiety with physical symptoms (panic attacks). What are the causes? The exact cause of GAD is not known. What increases the risk? This condition is more likely to develop in:  Women.  People who have a family history of anxiety disorders.  People who are very shy.  People who experience very stressful life events, such as the death of a loved one.  People who have a very stressful family environment. What are the signs or symptoms? People with GAD often worry excessively about many things in their lives, such as their health and family. They may also be overly concerned about:  Doing well at work.  Being on time.  Natural disasters.  Friendships. Physical symptoms of GAD include:  Fatigue.  Muscle tension or having muscle twitches.  Trembling or feeling shaky.  Being easily startled.  Feeling like your heart is pounding or racing.  Feeling out of breath or like you cannot take a deep breath.  Having trouble falling asleep or staying asleep.  Sweating.  Nausea, diarrhea, or irritable bowel syndrome (IBS).  Headaches.  Trouble concentrating or remembering facts.  Restlessness.  Irritability. How is this  diagnosed? Your health care provider can diagnose GAD based on your symptoms and medical history. You will also have a physical exam. The health care provider will ask specific questions about your symptoms, including how severe they are, when they started, and if they come and go. Your health care provider may ask you about your use of alcohol or drugs, including prescription medicines. Your health care provider may refer you to a mental health specialist for further evaluation. Your health care provider will do a thorough examination and may perform additional tests to rule out other possible causes of your symptoms. To be diagnosed with GAD, a person must have anxiety that:  Is out of his or her control.  Affects several different aspects of his or her life, such as work and relationships.  Causes distress that makes him or her unable to take part in normal activities.  Includes at least three physical symptoms of GAD, such as restlessness, fatigue, trouble concentrating, irritability, muscle tension, or sleep problems. Before your health care provider can confirm a diagnosis of GAD, these symptoms must  be present more days than they are not, and they must last for six months or longer. How is this treated? The following therapies are usually used to treat GAD:  Medicine. Antidepressant medicine is usually prescribed for long-term daily control. Antianxiety medicines may be added in severe cases, especially when panic attacks occur.  Talk therapy (psychotherapy). Certain types of talk therapy can be helpful in treating GAD by providing support, education, and guidance. Options include: ? Cognitive behavioral therapy (CBT). People learn coping skills and techniques to ease their anxiety. They learn to identify unrealistic or negative thoughts and behaviors and to replace them with positive ones. ? Acceptance and commitment therapy (ACT). This treatment teaches people how to be mindful as a way  to cope with unwanted thoughts and feelings. ? Biofeedback. This process trains you to manage your body's response (physiological response) through breathing techniques and relaxation methods. You will work with a therapist while machines are used to monitor your physical symptoms.  Stress management techniques. These include yoga, meditation, and exercise. A mental health specialist can help determine which treatment is best for you. Some people see improvement with one type of therapy. However, other people require a combination of therapies. Follow these instructions at home:  Take over-the-counter and prescription medicines only as told by your health care provider.  Try to maintain a normal routine.  Try to anticipate stressful situations and allow extra time to manage them.  Practice any stress management or self-calming techniques as taught by your health care provider.  Do not punish yourself for setbacks or for not making progress.  Try to recognize your accomplishments, even if they are small.  Keep all follow-up visits as told by your health care provider. This is important. Contact a health care provider if:  Your symptoms do not get better.  Your symptoms get worse.  You have signs of depression, such as: ? A persistently sad, cranky, or irritable mood. ? Loss of enjoyment in activities that used to bring you joy. ? Change in weight or eating. ? Changes in sleeping habits. ? Avoiding friends or family members. ? Loss of energy for normal tasks. ? Feelings of guilt or worthlessness. Get help right away if:  You have serious thoughts about hurting yourself or others. If you ever feel like you may hurt yourself or others, or have thoughts about taking your own life, get help right away. You can go to your nearest emergency department or call:  Your local emergency services (911 in the [place]  A suicide crisis helpline, such as the [place] at (352) 698-7334. This is open 24 hours a day. Summary  Generalized anxiety disorder (GAD) is a mental health disorder that involves worry that is not triggered by a specific event.  People with GAD often worry excessively about many things in their lives, such as their health and family.  GAD may cause physical symptoms such as restlessness, trouble concentrating, sleep problems, frequent sweating, nausea, diarrhea, headaches, and trembling or muscle twitching.  A mental health specialist can help determine which treatment is best for you. Some people see improvement with one type of therapy. However, other people require a combination of therapies. This information is not intended to replace advice given to you by your health care provider. Make sure you discuss any questions you have with your health care provider. Document Revised: 02/26/2017 Document Reviewed: 02/04/2016 Elsevier Patient Education  [place].  Venlafaxine extended-release capsules What is this medicine?  VENLAFAXINE(VEN la fax een) is used to treat depression, anxiety and panic disorder. This medicine may be used for other purposes; ask your health care provider or pharmacist if you have questions. COMMON BRAND NAME(S): Effexor XR What should I tell my health care provider before I take this medicine? They need to know if you have any of these conditions:  bleeding disorders  glaucoma  heart disease  high blood pressure  high cholesterol  kidney disease  liver disease  low levels of sodium in the blood  mania or bipolar disorder  seizures  suicidal thoughts, plans, or attempt; a previous suicide attempt by you or a family  take medicines that treat or prevent blood clots  thyroid disease  an unusual or allergic reaction to venlafaxine, desvenlafaxine, other medicines, foods, dyes, or preservatives  pregnant or trying to get pregnant  breast-feeding How should I use this  medicine? Take this medicine by mouth with a full glass of water. Follow the directions on the prescription label. Do not cut, crush, or chew this medicine. Take it with food. If needed, the capsule may be carefully opened and the entire contents sprinkled on a spoonful of cool applesauce. Swallow the applesauce/pellet mixture right away without chewing and follow with a glass of water to ensure complete swallowing of the pellets. Try to take your medicine at about the same time each day. Do not take your medicine more often than directed. Do not stop taking this medicine suddenly except upon the advice of your doctor. Stopping this medicine too quickly may cause serious side effects or your condition may worsen. A special MedGuide will be given to you by the pharmacist with each prescription and refill. Be sure to read this information carefully each time. Talk to your pediatrician regarding the use of this medicine in children. Special care may be needed. Overdosage: If you think you have taken too much of this medicine contact a poison control center or emergency room at once. NOTE: This medicine is only for you. Do not share this medicine with others. What if I miss a dose? If you miss a dose, take it as soon as you can. If it is almost time for your next dose, take only that dose. Do not take double or extra doses. What may interact with this medicine? Do not take this medicine with any of the following medications:  certain medicines for fungal infections like fluconazole, itraconazole, ketoconazole, posaconazole, voriconazole  cisapride  desvenlafaxine  dronedarone  duloxetine  levomilnacipran  linezolid  MAOIs like Carbex, Eldepryl, Marplan, Nardil, and Parnate  methylene blue (injected into a vein)  milnacipran  pimozide  thioridazine This medicine may also interact with the following medications:  amphetamines  aspirin and aspirin-like medicines  certain medicines  for depression, anxiety, or psychotic disturbances  certain medicines for migraine headaches like almotriptan, eletriptan, frovatriptan, naratriptan, rizatriptan, sumatriptan, zolmitriptan  certain medicines for sleep  certain medicines that treat or prevent blood clots like dalteparin, enoxaparin, warfarin  cimetidine  clozapine  diuretics  fentanyl  furazolidone  indinavir  isoniazid  lithium  metoprolol  NSAIDS, medicines for pain and inflammation, like ibuprofen or naproxen  other medicines that prolong the QT interval (cause an abnormal heart rhythm) like dofetilide, ziprasidone  procarbazine  rasagiline  supplements like St. John's wort, kava kava, valerian  tramadol  tryptophan This list may not describe all possible interactions. Give your health care provider a list of all the medicines, herbs, non-prescription drugs, or dietary supplements  you use. Also tell them if you smoke, drink alcohol, or use illegal drugs. Some items may interact with your medicine. What should I watch for while using this medicine? Tell your doctor if your symptoms do not get better or if they get worse. Visit your doctor or health care professional for regular checks on your progress. Because it may take several weeks to see the full effects of this medicine, it is important to continue your treatment as prescribed by your doctor. Patients and their families should watch out for new or worsening thoughts of suicide or depression. Also watch out for sudden changes in feelings such as feeling anxious, agitated, panicky, irritable, hostile, aggressive, impulsive, severely restless, overly excited and hyperactive, or not being able to sleep. If this happens, especially at the beginning of treatment or after a change in dose, call your health care professional. This medicine can cause an increase in blood pressure. Check with your doctor for instructions on monitoring your blood pressure while  taking this medicine. You may get drowsy or dizzy. Do not drive, use machinery, or do anything that needs mental alertness until you know how this medicine affects you. Do not stand or sit up quickly, especially if you are an older patient. This reduces the risk of dizzy or fainting spells. Alcohol may interfere with the effect of this medicine. Avoid alcoholic drinks. Your mouth may get dry. Chewing sugarless gum, sucking hard candy and drinking plenty of water will help. Contact your doctor if the problem does not go away or is severe. What side effects may I notice from receiving this medicine? Side effects that you should report to your doctor or health care professional as soon as possible:  allergic reactions like skin rash, itching or hives, swelling of the face, lips, or tongue  anxious  breathing problems  confusion  changes in vision  chest pain  confusion  elevated mood, decreased need for sleep, racing thoughts, impulsive behavior  eye pain  fast, irregular heartbeat  feeling faint or lightheaded, falls  feeling agitated, angry, or irritable  hallucination, loss of contact with reality  high blood pressure  loss of balance or coordination  palpitations  redness, blistering, peeling or loosening of the skin, including inside the mouth  restlessness, pacing, inability to keep still  seizures  stiff muscles  suicidal thoughts or other mood changes  trouble passing urine or change in the amount of urine  trouble sleeping  unusual bleeding or bruising  unusually weak or tired  vomiting Side effects that usually do not require medical attention (report to your doctor or health care professional if they continue or are bothersome):  change in sex drive or performance  change in appetite or weight  constipation  dizziness  dry mouth  headache  increased sweating  nausea  tired This list may not describe all possible side effects. Call  your doctor for medical advice about side effects. You may report side effects to FDA at 1-800-FDA-1088. Where should I keep my medicine? Keep out of the reach of children. Store at a controlled temperature between 20 and 25 degrees C (68 degrees and 77 degrees F), in a dry place. Throw away any unused medicine after the expiration date. NOTE: This sheet is a summary. It may not cover all possible information. If you have questions about this medicine, talk to your doctor, pharmacist, or health care provider.  2020 Elsevier/Gold Standard (2018-03-08 12:06:43)

## 2020-03-24 ENCOUNTER — Other Ambulatory Visit: Payer: Self-pay | Admitting: Gastroenterology

## 2020-03-24 DIAGNOSIS — K580 Irritable bowel syndrome with diarrhea: Secondary | ICD-10-CM

## 2020-04-08 ENCOUNTER — Other Ambulatory Visit: Payer: Self-pay | Admitting: Family Medicine

## 2020-04-23 ENCOUNTER — Encounter: Payer: Self-pay | Admitting: Adult Health

## 2020-05-31 ENCOUNTER — Other Ambulatory Visit: Payer: Self-pay | Admitting: Adult Health

## 2020-05-31 ENCOUNTER — Telehealth: Payer: Self-pay | Admitting: Adult Health

## 2020-05-31 NOTE — Telephone Encounter (Signed)
Already ordered today in another encounter. 

## 2020-05-31 NOTE — Telephone Encounter (Signed)
Copied from CRM [id number]. Topic: Quick Communication - Rx Refill/Question >> May 31, 2020  9:46 AM Jaquita Rector A wrote: Medication: venlafaxine XR (EFFEXOR-XR) 75 MG 24 hr capsule Per patirnt she is out and it will take some time to get refill from [facility] asking for this today 30 day supply  Has the patient contacted their pharmacy? Yes.   (Agent: If no, request that the patient contact the pharmacy for the refill.) (Agent: If yes, when and what did the pharmacy advise?)  Preferred Pharmacy (with phone number or street name): [facility] [address] [place], [place] - [address]  Phone:  [phone] Fax:  [phone]     Agent: Please be advised that RX refills may take up to 3 business days. We ask that you follow-up with your pharmacy.

## 2020-06-25 ENCOUNTER — Other Ambulatory Visit: Payer: Self-pay

## 2020-06-26 MED ORDER — NORETHIN ACE-ETH ESTRAD-FE 1-20 MG-MCG PO TABS: 1.0000 | ORAL_TABLET | Freq: Every day | ORAL | 1 refills | Status: DC

## 2020-07-04 ENCOUNTER — Encounter: Payer: 59 | Admitting: Physician Assistant

## 2020-07-04 NOTE — Progress Notes (Signed)
Patient submitted e-visit for motion sickness but is just wanting xanax for an upcoming flight which is not appropriate for e-visit. Message sent to patient with further instruction.

## 2020-08-15 ENCOUNTER — Ambulatory Visit: Payer: Self-pay

## 2020-08-15 NOTE — Telephone Encounter (Signed)
Pt. Reports she has "nodules" to both thighs, outer aspects. These developed Tuesday and Wednesday. There are 5-6 on each thigh. They are 1-11/2 inch diameter. Painful and warm to touch. Feels "unwell". No other symptoms. No availability in the practice today. Pt. Will go to [facility].  Reason for Disposition . [1] Thigh, calf, or ankle swelling AND [2] bilateral AND [3] 1 side is more swollen  Answer Assessment - Initial Assessment Questions 1. ONSET: "When did the pain start?"      Tuesday 2. LOCATION: "Where is the pain located?"      Both thighs - nodules 3. PAIN: "How bad is the pain?"    (Scale 1-10; or mild, moderate, severe)   -  MILD (1-3): doesn't interfere with normal activities    -  MODERATE (4-7): interferes with normal activities (e.g., work or school) or awakens from sleep, limping    -  SEVERE (8-10): excruciating pain, unable to do any normal activities, unable to walk     Moderate 4. WORK OR EXERCISE: "Has there been any recent work or exercise that involved this part of the body?"      No 5. CAUSE: "What do you think is causing the leg pain?"     Unsure 6. OTHER SYMPTOMS: "Do you have any other symptoms?" (e.g., chest pain, back pain, breathing difficulty, swelling, rash, fever, numbness, weakness)     Redness,painful,feels 7. PREGNANCY: "Is there any chance you are pregnant?" "When was your last menstrual period?"     No  Protocols used: LEG PAIN-A-AH

## 2020-08-27 ENCOUNTER — Telehealth: Payer: Self-pay

## 2020-08-27 NOTE — Telephone Encounter (Signed)
Copied from CRM [id number]. Topic: Appointment Scheduling - Scheduling Inquiry for Clinic >> Aug 27, 2020  9:35 AM Fanny Bien wrote: Reason for ZNB:VAPOLID would like to be seen this week for an appointment. She states that she has red painful bumps on left leg. Please advise

## 2020-09-17 ENCOUNTER — Telehealth: Payer: Self-pay | Admitting: Gastroenterology

## 2020-09-17 DIAGNOSIS — K580 Irritable bowel syndrome with diarrhea: Secondary | ICD-10-CM

## 2020-09-17 MED ORDER — DICYCLOMINE HCL 10 MG PO CAPS: ORAL_CAPSULE | ORAL | 0 refills | Status: DC

## 2020-09-17 NOTE — Telephone Encounter (Signed)
Patient is due for a follow up appointment since have not been seen in over a year. Made appointment for 12/03/2020

## 2020-09-17 NOTE — Telephone Encounter (Signed)
Patient request 90 days  dicyclomine (BENTYL) 10 MG capsule Medication Date: 03/26/2020 Department: [facility] Ordering/Authorizing: Toney Reil, MD    Order Providers  Prescribing Provider Encounter Provider  Vanga, Loel Dubonnet, MD Toney Reil, MD   Outpatient Medication Detail   Disp Refills Start End   dicyclomine (BENTYL) 10 MG capsule 30 capsule 1 03/26/2020    Sig: TAKE 1 CAPSULE BY MOUTH 4 TIMES DAILY BEFORE  MEALS  AND  AT  BEDTIME  FOR  30  DOSES   Sent to pharmacy as: dicyclomine (BENTYL) 10 MG capsule   E-Prescribing Status: Receipt confirmed by pharmacy (03/26/2020  8:01 AM EST)    Associated Diagnoses  Irritable bowel syndrome with diarrhea      Pharmacy  [facility] [address] [place], [place] - [address]   Additional Information  Associated Reports  View Encounter  Priority and Order Details

## 2020-10-10 IMAGING — MG DIGITAL DIAGNOSTIC UNILAT LEFT W/ CAD
4 series · 4 of 8 positions shown · non-contrast
Comparison: Previous exam(s).

CLINICAL DATA: Calcifications in the inferior left breast on a
recent screening mammogram.

EXAM:
DIGITAL DIAGNOSTIC UNILATERAL LEFT MAMMOGRAM WITH CAD AND TOMO

[L ML]
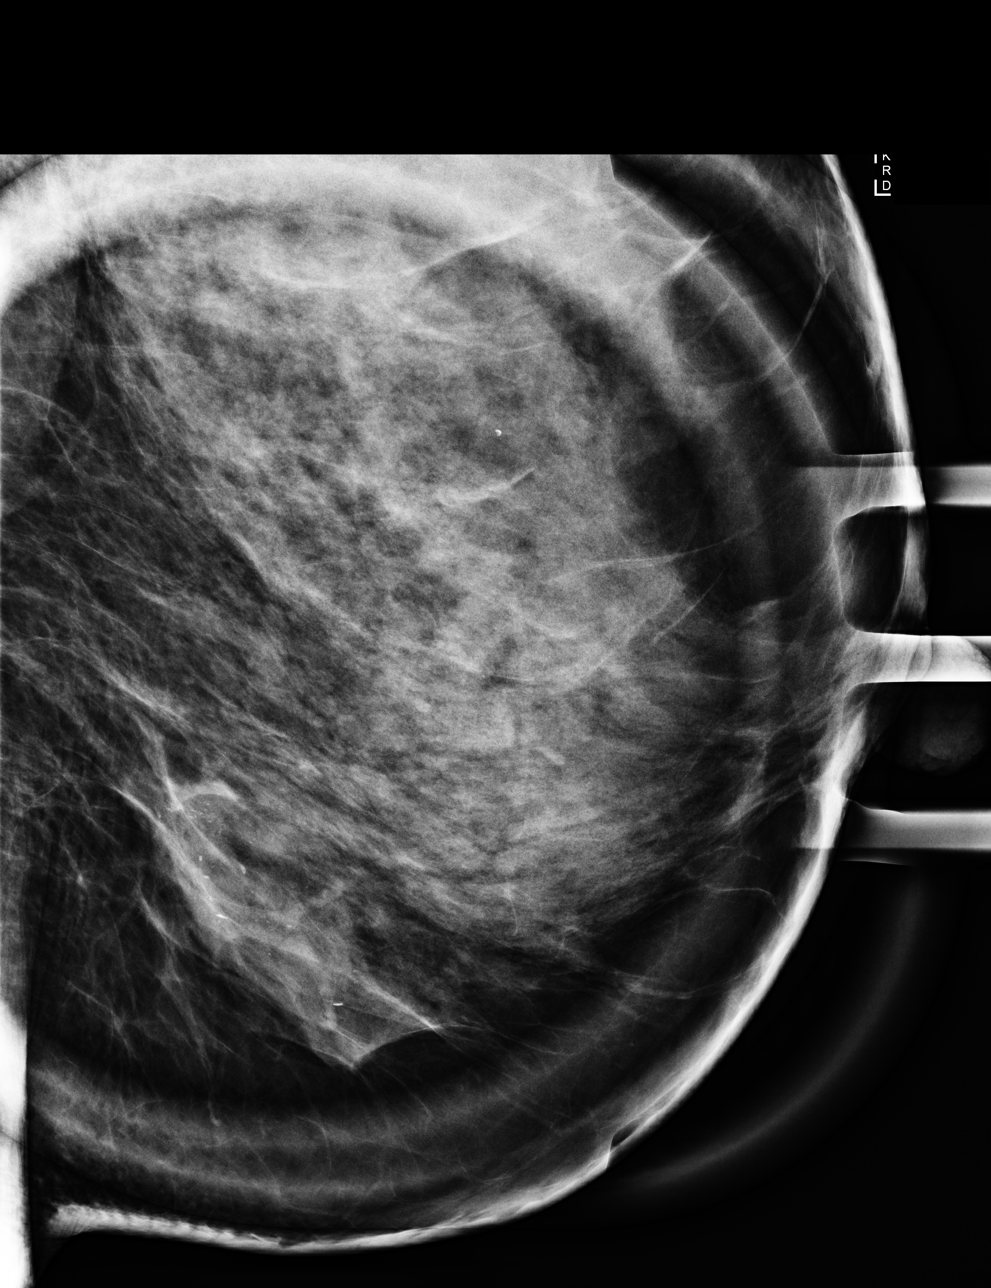

[L CC]
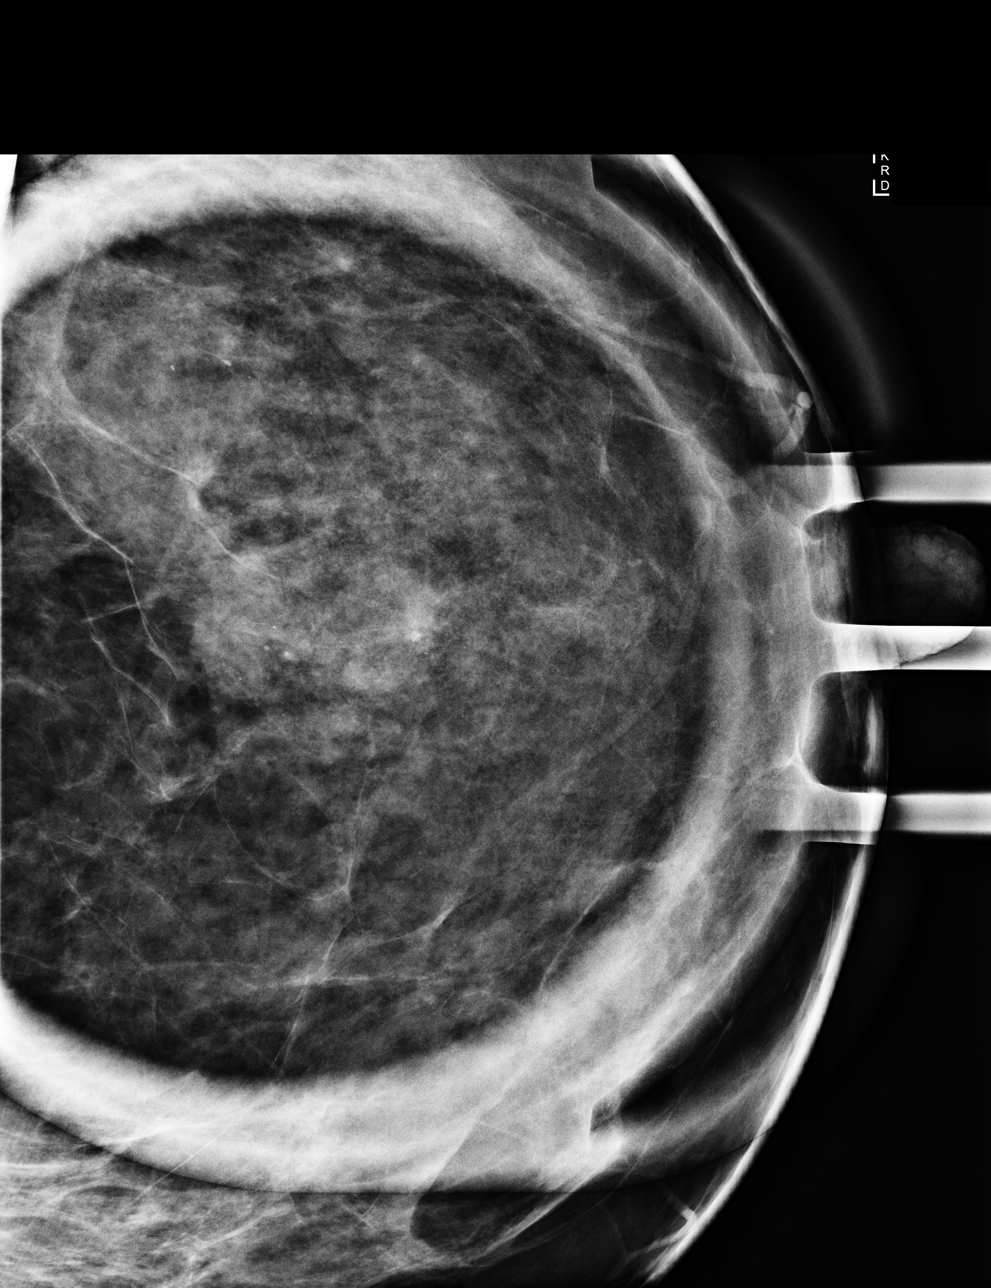

[L ML synth-2D]
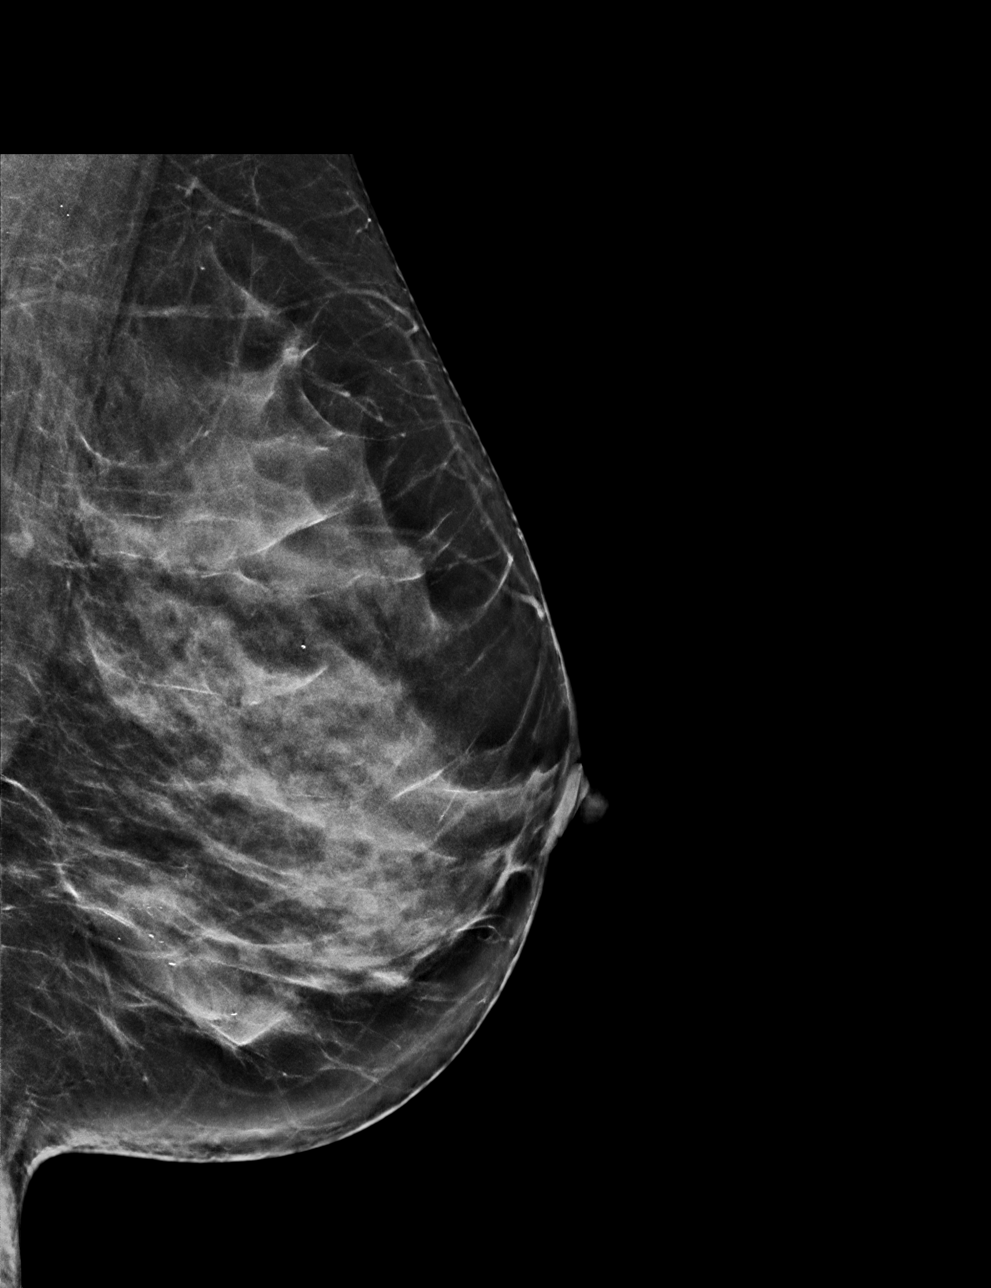

[L ML tomo · tomo slice 34/67.0]
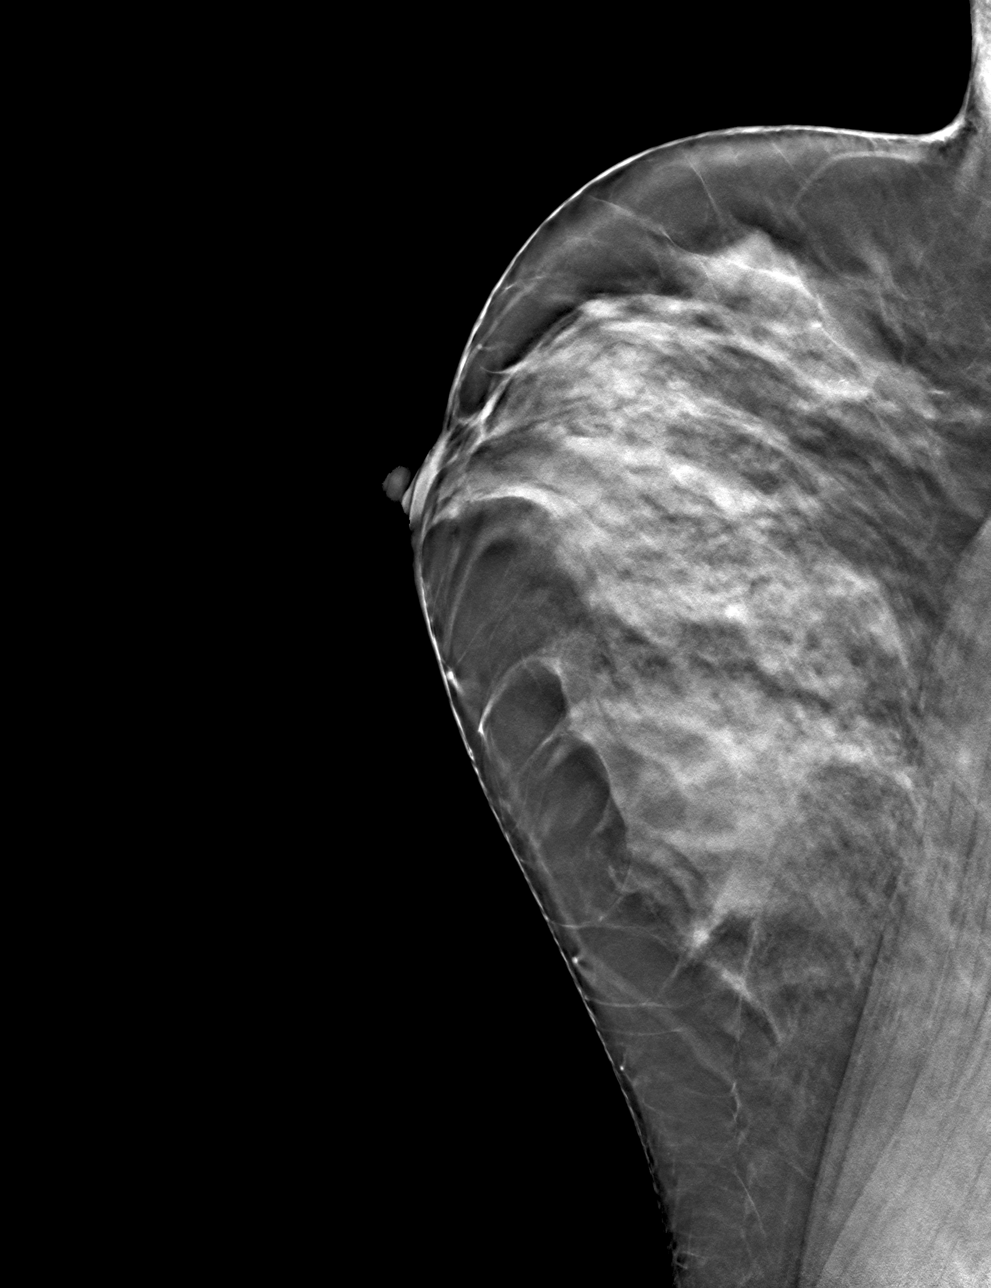

[4 of 8 positions shown; findings below may reference images not displayed]

ACR Breast Density Category c: The breast tissue is heterogeneously
dense, which may obscure small masses.
FINDINGS: 3D tomographic and 2D generated full paddle lateral images of the
left breast were obtained as well as 2D spot compression
magnification views. These demonstrate calcifications in the
inferior portion of the breast spanning area measuring 3.8 cm in
maximum diameter. These are rounded and poorly defined in the
craniocaudal projection and demonstrate dependent layering in the
true lateral projection. No findings suspicious for malignancy.

Mammographic images were processed with CAD.
IMPRESSION: Left breast benign milk of calcium.  No evidence of malignancy.

RECOMMENDATION:
Bilateral screening mammogram in 1 year.

I have discussed the findings and recommendations with the patient.
If applicable, a reminder letter will be sent to the patient
regarding the next appointment.

BI-RADS CATEGORY  2: Benign.

## 2020-10-23 ENCOUNTER — Other Ambulatory Visit: Payer: Self-pay | Admitting: Family Medicine

## 2020-10-28 ENCOUNTER — Other Ambulatory Visit: Payer: Self-pay | Admitting: Adult Health

## 2020-12-03 ENCOUNTER — Other Ambulatory Visit: Payer: Self-pay

## 2020-12-03 ENCOUNTER — Encounter: Payer: Self-pay | Admitting: Gastroenterology

## 2020-12-03 ENCOUNTER — Ambulatory Visit (INDEPENDENT_AMBULATORY_CARE_PROVIDER_SITE_OTHER): Payer: 59 | Admitting: Gastroenterology

## 2020-12-03 VITALS — BP 172/85 | HR 108 | Temp 97.7°F | Ht 66.0 in | Wt 164.0 lb

## 2020-12-03 DIAGNOSIS — K580 Irritable bowel syndrome with diarrhea: Secondary | ICD-10-CM

## 2020-12-03 NOTE — Progress Notes (Signed)
Arlyss Repress, MD [address]  [address]  [place], [place] [address]  Main: [phone]  Fax: [phone]    Gastroenterology Consultation  Referring Provider:     Stephanie Acre* Primary Care Physician:  Dorothey Baseman, MD Primary Gastroenterologist:  Dr. Arlyss Repress Reason for Consultation:   Abdominal cramps and bowel urgency        HPI:   Yolanda Curtis is a 45 y.o. female referred by Dr. Dorothey Baseman, MD  for consultation & management of abdominal cramps and bowel urgency.  Patient reports that she has had history of ischemic colitis about 20 years ago when she had rectal bleeding, attributed to topical acne treatment.  Patient is here today to establish care, she moved from [place] to [place].  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  Patient reports that for last several months she has been experiencing abdominal cramps which she thinks is likely secondary to anxiety whenever she drives car or goes out.  Abdominal cramps are associated with bowel urgency.  She denies epigastric pain, heartburn or regurgitation.  She reports that she may have tried antispasmodic medication over 20 years ago.  She is currently on Effexor, with increase in dose about 2 weeks ago to control her anxiety.  She believes that she can see the difference.  She denies rectal bleeding, abdominal bloating, nausea or vomiting.  She reports having had a colonoscopy in [place] approximately 2017 and she was told that she had some inflammation but no polyps identified. Her weight has been stable, labs including CMP, CBC are unremarkable  Follow-up visit 12/03/2020 Patient reports doing well with regards to her GI symptoms.  She takes Bentyl occasionally, especially going out or during weekends which helps controlling her symptoms.  She had a repeat colonoscopy with biopsies, no evidence of IBD or microscopic colitis.  H. pylori breath test negative,  celiac panel negative.  Her weight has been stable.  Most recent labs unremarkable  NSAIDs: None  Antiplts/Anticoagulants/Anti thrombotics: None  GI Procedures: Colonoscopy in 2017, biopsies revealed indeterminate colitis  Colonoscopy 08/23/2019 - The examined portion of the ileum was normal. - The entire examined colon is normal. Biopsied. - The distal rectum and anal verge are normal on retroflexion view. - Diverticulosis in the sigmoid colon and in the descending colon.  DIAGNOSIS:  A.  COLON; RANDOM COLD BIOPSY:  - COLONIC MUCOSA WITH INTACT CRYPT ARCHITECTURE.  - NEGATIVE FOR ACTIVE INFLAMMATION, MICROSCOPIC COLITIS, DYSPLASIA, AND  MALIGNANCY.   She does not smoke, drinks 1 to 2 glasses of wine with dinner daily She did not have any GI surgeries  Past Medical History:  Diagnosis Date   Allergy    Anxiety    Chronic gastritis without bleeding 04/21/2019   Complication of anesthesia    Depression    GERD (gastroesophageal reflux disease)    PONV (postoperative nausea and vomiting)    Vaginal Pap smear, abnormal     Past Surgical History:  Procedure Laterality Date   AXILLARY HIDRADENITIS EXCISION     CESAREAN SECTION  x 2   COLONOSCOPY WITH PROPOFOL N/A 08/23/2019   Procedure: COLONOSCOPY WITH PROPOFOL;  Surgeon: Toney Reil, MD;  Location: [facility] ENDOSCOPY;  Service: Gastroenterology;  Laterality: N/A;   TUBAL LIGATION      Current Outpatient Medications:    Cholecalciferol (VITAMIN D3) 50 MCG (2000 UT) capsule, Take by mouth., Disp: , Rfl:    dicyclomine (  BENTYL) 10 MG capsule, TAKE 1 CAPSULE BY MOUTH 4 TIMES DAILY BEFORE  MEALS  AND  AT  BEDTIME  FOR  30  DOSES, Disp: 90 capsule, Rfl: 0   JUNEL FE 1/20 1-20 MG-MCG tablet, TAKE 1 TABLET BY MOUTH  DAILY, Disp: 84 tablet, Rfl: 3   loratadine (CLARITIN) 10 MG tablet, Take 10 mg by mouth daily., Disp: , Rfl:    Multiple Vitamin (MULTIVITAMIN) tablet, Take 1 tablet by mouth daily., Disp: , Rfl:    venlafaxine XR  (EFFEXOR-XR) 75 MG 24 hr capsule, TAKE 1 CAPSULE BY MOUTH ONCE DAILY WITH BREAKFAST, Disp: 90 capsule, Rfl: 1    Family History  Problem Relation Age of Onset   Hypertension Father    Hypercholesterolemia Father    Stroke Father    Hypercholesterolemia Mother    Depression Mother    Hyperlipidemia Mother    Breast cancer Maternal Aunt 72   Cancer - Other Maternal Aunt      Social History   Tobacco Use   Smoking status: Former   Smokeless tobacco: Never  Building services engineer Use: Never used  Substance Use Topics   Alcohol use: Yes    Alcohol/week: 10.0 standard drinks    Types: 10 Glasses of wine per week    Comment: 6-10   Drug use: No    Allergies as of 12/03/2020 - Review Complete 12/03/2020  Allergen Reaction Noted   Penicillins Rash 12/03/2020    Review of Systems:    All systems reviewed and negative except where noted in HPI.   Physical Exam:  BP (!) 172/85 (BP Location: Right Arm, Patient Position: Sitting, Cuff Size: Normal)   Pulse (!) 108   Temp 97.7 F (36.5 C) (Oral)   Ht 5\' 6"  (1.676 m)   Wt 164 lb (74.4 kg)   BMI 26.47 kg/m  No LMP recorded.  General:   Alert,  Well-developed, well-nourished, pleasant and cooperative in NAD Head:  Normocephalic and atraumatic. Eyes:  Sclera clear, no icterus.   Conjunctiva pink. Ears:  Normal auditory acuity. Nose:  No deformity, discharge, or lesions. Mouth:  No deformity or lesions,oropharynx pink & moist. Neck:  Supple; no masses or thyromegaly. Lungs:  Respirations even and unlabored.  Clear throughout to auscultation.   No wheezes, crackles, or rhonchi. No acute distress. Heart:  Regular rate and rhythm; no murmurs, clicks, rubs, or gallops. Abdomen:  Normal bowel sounds. Soft, non-tender and non-distended without masses, hepatosplenomegaly or hernias noted.  No guarding or rebound tenderness.   Rectal: Not performed Msk:  Symmetrical without gross deformities. Good, equal movement & strength  bilaterally. Pulses:  Normal pulses noted. Extremities:  No clubbing or edema.  No cyanosis. Neurologic:  Alert and oriented x3;  grossly normal neurologically. Skin:  Intact without significant lesions or rashes. No jaundice. Psych:  Alert and cooperative. Normal mood and affect.  Imaging Studies: None  Assessment and Plan:   Yolanda Curtis is a 45 y.o. female with history of anxiety, history of ischemic colitis is seen in consultation for chronic abdominal cramps and bowel urgency.  Patient does not have any alarm signs or symptoms at this time.  Based on her history, it appears that her lower GI symptoms are secondary to situational anxiety.  Colonoscopy is unremarkable.  Continue Bentyl as needed  Follow up as needed   55, MD

## 2020-12-04 NOTE — Addendum Note (Signed)
Addended by: Linward Foster on: 12/04/2020 01:37 PM   Modules accepted: Orders

## 2021-02-28 ENCOUNTER — Other Ambulatory Visit: Payer: Self-pay | Admitting: *Deleted

## 2021-02-28 MED ORDER — NORETHIN ACE-ETH ESTRAD-FE 1-20 MG-MCG PO TABS: 1.0000 | ORAL_TABLET | Freq: Every day | ORAL | 3 refills | Status: DC

## 2021-07-01 ENCOUNTER — Other Ambulatory Visit: Payer: Self-pay | Admitting: *Deleted

## 2021-07-01 MED ORDER — NORETHIN ACE-ETH ESTRAD-FE 1-20 MG-MCG PO TABS: 1.0000 | ORAL_TABLET | Freq: Every day | ORAL | 0 refills | Status: DC

## 2021-12-03 ENCOUNTER — Other Ambulatory Visit: Payer: Self-pay | Admitting: Family Medicine

## 2021-12-03 DIAGNOSIS — Z1231 Encounter for screening mammogram for malignant neoplasm of breast: Secondary | ICD-10-CM

## 2021-12-25 ENCOUNTER — Ambulatory Visit
Admission: RE | Admit: 2021-12-25 | Discharge: 2021-12-25 | Disposition: A | Discharge status: Home / Self Care | Payer: BC Managed Care – PPO | Source: Ambulatory Visit | Attending: Family Medicine | Admitting: Family Medicine

## 2021-12-25 DIAGNOSIS — Z1231 Encounter for screening mammogram for malignant neoplasm of breast: Secondary | ICD-10-CM | POA: Insufficient documentation

## 2022-01-27 ENCOUNTER — Ambulatory Visit (INDEPENDENT_AMBULATORY_CARE_PROVIDER_SITE_OTHER): Payer: BC Managed Care – PPO | Admitting: Obstetrics & Gynecology

## 2022-01-27 ENCOUNTER — Other Ambulatory Visit (HOSPITAL_COMMUNITY)
Admission: RE | Admit: 2022-01-27 | Discharge: 2022-01-27 | Disposition: A | Discharge status: Home / Self Care | Payer: BC Managed Care – PPO | Source: Ambulatory Visit | Attending: Obstetrics & Gynecology | Admitting: Obstetrics & Gynecology

## 2022-01-27 ENCOUNTER — Encounter: Payer: Self-pay | Admitting: Obstetrics & Gynecology

## 2022-01-27 VITALS — BP 119/79 | HR 91 | Ht 62.0 in | Wt 158.0 lb

## 2022-01-27 DIAGNOSIS — Z01419 Encounter for gynecological examination (general) (routine) without abnormal findings: Secondary | ICD-10-CM

## 2022-01-27 NOTE — Progress Notes (Signed)
GYNECOLOGY ANNUAL PREVENTATIVE CARE ENCOUNTER NOTE  History:     Yolanda Curtis is a 46 y.o. G43P2002 female here for a routine annual gynecologic exam.  Current complaints: none.   Denies abnormal vaginal bleeding, discharge, pelvic pain, problems with intercourse or other gynecologic concerns.    Gynecologic History Patient's last menstrual period was 01/17/2022. Contraception: tubal ligation Last Pap: 08/23/2018. Result was normal with negative HPV Last Mammogram: 12/25/21.  Result was normal Last Colonoscopy: 08/23/2019.  Result was normal  Obstetric History OB History  Gravida Para Term Preterm AB Living  2 2 2     2   SAB IAB Ectopic Multiple Live Births          2    # Outcome Date GA Lbr Len/2nd Weight Sex Delivery Anes PTL Lv  2 Term 08/30/13    M CS-LTranv   LIV  1 Term 08/20/04    F CS-LTranv   LIV    Past Medical History:  Diagnosis Date   Allergy    Anxiety    Chronic gastritis without bleeding 04/21/2019   Complication of anesthesia    Depression    GERD (gastroesophageal reflux disease)    PONV (postoperative nausea and vomiting)    Vaginal Pap smear, abnormal     Past Surgical History:  Procedure Laterality Date   AXILLARY HIDRADENITIS EXCISION     CESAREAN SECTION  x 2   COLONOSCOPY WITH PROPOFOL N/A 08/23/2019   Procedure: COLONOSCOPY WITH PROPOFOL;  Surgeon: 08/25/2019, MD;  Location: [facility] ENDOSCOPY;  Service: Gastroenterology;  Laterality: N/A;   TUBAL LIGATION      Current Outpatient Medications on File Prior to Visit  Medication Sig Dispense Refill   Cholecalciferol (VITAMIN D3) 50 MCG (2000 UT) capsule Take by mouth.     dicyclomine (BENTYL) 10 MG capsule TAKE 1 CAPSULE BY MOUTH 4 TIMES DAILY BEFORE  MEALS  AND  AT  BEDTIME  FOR  30  DOSES 90 capsule 0   loratadine (CLARITIN) 10 MG tablet Take 10 mg by mouth daily.     Magnesium 200 MG TABS      Multiple Vitamin (MULTIVITAMIN) tablet Take 1 tablet by mouth daily.     venlafaxine XR  (EFFEXOR-XR) 75 MG 24 hr capsule TAKE 1 CAPSULE BY MOUTH ONCE DAILY WITH BREAKFAST 90 capsule 1   WEGOVY 0.25 MG/0.5ML SOAJ Inject into the skin.     No current facility-administered medications on file prior to visit.    Allergies  Allergen Reactions   Penicillins Rash    Social History:  reports that she has quit smoking. She has never used smokeless tobacco. She reports current alcohol use of about 10.0 standard drinks of alcohol per week. She reports that she does not use drugs.  Family History  Problem Relation Age of Onset   Hypertension Father    Hypercholesterolemia Father    Stroke Father    Hypercholesterolemia Mother    Depression Mother    Hyperlipidemia Mother    Breast cancer Maternal Aunt 26   Cancer - Other Maternal Aunt     The following portions of the patient's history were reviewed and updated as appropriate: allergies, current medications, past family history, past medical history, past social history, past surgical history and problem list.  Review of Systems Pertinent items noted in HPI and remainder of comprehensive ROS otherwise negative.  Physical Exam:  BP 119/79   Pulse 91   Ht 5\' 2"  (1.575 m)  Wt 158 lb (71.7 kg)   LMP 01/17/2022   BMI 28.90 kg/m  CONSTITUTIONAL: Well-developed, well-nourished female in no acute distress.  HENT:  Normocephalic, atraumatic, External right and left ear normal.  EYES: Conjunctivae and EOM are normal. Pupils are equal, round, and reactive to light. No scleral icterus.  NECK: Normal range of motion, supple, no masses.  Normal thyroid.  SKIN: Skin is warm and dry. No rash noted. Not diaphoretic. No erythema. No pallor. MUSCULOSKELETAL: Normal range of motion. No tenderness.  No cyanosis, clubbing, or edema. NEUROLOGIC: Alert and oriented to person, place, and time. Normal reflexes, muscle tone coordination.  PSYCHIATRIC: Normal mood and affect. Normal behavior. Normal judgment and thought content. CARDIOVASCULAR:  Normal heart rate noted, regular rhythm RESPIRATORY: Clear to auscultation bilaterally. Effort and breath sounds normal, no problems with respiration noted. BREASTS: Symmetric in size. No masses, tenderness, skin changes, nipple drainage, or lymphadenopathy bilaterally. Performed in the presence of a chaperone. ABDOMEN: Soft, no distention noted.  No tenderness, rebound or guarding.  PELVIC: Normal appearing external genitalia and urethral meatus; normal appearing vaginal mucosa and cervix.  Scant brown vaginal discharge noted, patient just finished period.  Pap smear obtained.  Normal uterine size, no other palpable masses, no uterine or adnexal tenderness.  Performed in the presence of a chaperone.   Assessment and Plan:    1. Well woman exam with routine gynecological exam - Cytology - PAP Will follow up results of pap smear and manage accordingly. Mammogram  and colon cancer screening are up to date. Routine preventative health maintenance measures emphasized. Please refer to After Visit Summary for other counseling recommendations.      Verita Schneiders, MD, [place] [facility] [employer], [place]

## 2022-01-29 LAB — CYTOLOGY - PAP
Comment: NEGATIVE
Diagnosis: NEGATIVE
High risk HPV: NEGATIVE

## 2023-02-02 ENCOUNTER — Other Ambulatory Visit: Payer: Self-pay | Admitting: Family Medicine

## 2023-02-02 ENCOUNTER — Encounter: Payer: Self-pay | Admitting: Obstetrics & Gynecology

## 2023-02-02 DIAGNOSIS — Z1231 Encounter for screening mammogram for malignant neoplasm of breast: Secondary | ICD-10-CM

## 2023-02-11 ENCOUNTER — Ambulatory Visit: Payer: BC Managed Care – PPO | Admitting: Obstetrics & Gynecology

## 2023-02-19 ENCOUNTER — Ambulatory Visit
Admission: RE | Admit: 2023-02-19 | Discharge: 2023-02-19 | Disposition: A | Discharge status: Home / Self Care | Payer: BC Managed Care – PPO | Source: Ambulatory Visit | Attending: Family Medicine | Admitting: Family Medicine

## 2023-02-19 DIAGNOSIS — Z1231 Encounter for screening mammogram for malignant neoplasm of breast: Secondary | ICD-10-CM | POA: Diagnosis present

## 2023-03-31 DIAGNOSIS — K9189 Other postprocedural complications and disorders of digestive system: Secondary | ICD-10-CM

## 2023-03-31 HISTORY — DX: Other postprocedural complications and disorders of digestive system: K91.89

## 2023-09-25 DIAGNOSIS — K3532 Acute appendicitis with perforation and localized peritonitis, without abscess: Secondary | ICD-10-CM

## 2023-09-25 HISTORY — DX: Acute appendicitis with perforation, localized peritonitis, and gangrene, without abscess: K35.32

## 2023-10-10 ENCOUNTER — Other Ambulatory Visit: Payer: Self-pay

## 2023-10-10 ENCOUNTER — Emergency Department: Admission: EM | Admit: 2023-10-10 | Discharge: 2023-10-10 | Disposition: A | Discharge status: Home / Self Care

## 2023-10-10 ENCOUNTER — Emergency Department

## 2023-10-10 DIAGNOSIS — R1032 Left lower quadrant pain: Secondary | ICD-10-CM | POA: Diagnosis not present

## 2023-10-10 DIAGNOSIS — M545 Low back pain, unspecified: Secondary | ICD-10-CM | POA: Diagnosis present

## 2023-10-10 DIAGNOSIS — G8929 Other chronic pain: Secondary | ICD-10-CM | POA: Diagnosis not present

## 2023-10-10 DIAGNOSIS — G8918 Other acute postprocedural pain: Secondary | ICD-10-CM

## 2023-10-10 LAB — COMPREHENSIVE METABOLIC PANEL WITH GFR
ALT: 26 U/L (ref 0–44)
AST: 23 U/L (ref 15–41)
Albumin: 4 g/dL (ref 3.5–5.0)
Alkaline Phosphatase: 48 U/L (ref 38–126)
Anion gap: 9 (ref 5–15)
BUN: 10 mg/dL (ref 6–20)
CO2: 24 mmol/L (ref 22–32)
Calcium: 9.3 mg/dL (ref 8.9–10.3)
Chloride: 106 mmol/L (ref 98–111)
Creatinine, Ser: 0.43 mg/dL — ABNORMAL LOW (ref 0.44–1.00)
GFR, Estimated: >60 mL/min (ref >60)
Glucose, Bld: 99 mg/dL (ref 70–99)
Potassium: 3.9 mmol/L (ref 3.5–5.1)
Sodium: 139 mmol/L (ref 135–145)
Total Bilirubin: 0.4 mg/dL (ref 0.0–1.2)
Total Protein: 7.2 g/dL (ref 6.5–8.1)

## 2023-10-10 LAB — CBC WITH DIFFERENTIAL/PLATELET
Abs Immature Granulocytes: 0.02 K/uL (ref 0.00–0.07)
Basophils Absolute: 0.1 K/uL (ref 0.0–0.1)
Basophils Relative: 2 %
Eosinophils Absolute: 0.2 K/uL (ref 0.0–0.5)
Eosinophils Relative: 3 %
HCT: 39.8 % (ref 36.0–46.0)
Hemoglobin: 12.9 g/dL (ref 12.0–15.0)
Immature Granulocytes: 0 %
Lymphocytes Relative: 29 %
Lymphs Abs: 1.7 K/uL (ref 0.7–4.0)
MCH: 32.8 pg (ref 26.0–34.0)
MCHC: 32.4 g/dL (ref 30.0–36.0)
MCV: 101.3 fL — ABNORMAL HIGH (ref 80.0–100.0)
Monocytes Absolute: 0.5 K/uL (ref 0.1–1.0)
Monocytes Relative: 8 %
Neutro Abs: 3.6 K/uL (ref 1.7–7.7)
Neutrophils Relative %: 58 %
Platelets: 535 K/uL — ABNORMAL HIGH (ref 150–400)
RBC: 3.93 MIL/uL (ref 3.87–5.11)
RDW: 12.3 % (ref 11.5–15.5)
Smear Review: NORMAL
WBC Morphology: >10
WBC: 6.1 K/uL (ref 4.0–10.5)
nRBC: 0 % (ref 0.0–0.2)

## 2023-10-10 LAB — URINALYSIS, W/ REFLEX TO CULTURE (INFECTION SUSPECTED)
Bilirubin Urine: NEGATIVE
Glucose, UA: NEGATIVE mg/dL
Hgb urine dipstick: NEGATIVE
Ketones, ur: NEGATIVE mg/dL
Leukocytes,Ua: NEGATIVE
Nitrite: NEGATIVE
Protein, ur: NEGATIVE mg/dL
Specific Gravity, Urine: 1.04 — ABNORMAL HIGH (ref 1.005–1.030)
pH: 6 (ref 5.0–8.0)

## 2023-10-10 LAB — PROTIME-INR
INR: 1 (ref 0.8–1.2)
Prothrombin Time: 13.5 s (ref 11.4–15.2)

## 2023-10-10 LAB — HCG, QUANTITATIVE, PREGNANCY: hCG, Beta Chain, Quant, S: 1 m[IU]/mL (ref <5)

## 2023-10-10 LAB — LACTIC ACID, PLASMA
Lactic Acid, Venous: 3 mmol/L (ref 0.5–1.9)
Lactic Acid, Venous: 1.6 mmol/L (ref 0.5–1.9)

## 2023-10-10 MED ORDER — SODIUM CHLORIDE 0.9 % IV BOLUS
1000.0000 mL | Freq: Once | INTRAVENOUS | Status: AC
  Administered 2023-10-10: 1000 mL via INTRAVENOUS

## 2023-10-10 MED ORDER — IOHEXOL 300 MG/ML  SOLN
80.0000 mL | Freq: Once | INTRAMUSCULAR | Status: AC | PRN
  Administered 2023-10-10: 80 mL via INTRAVENOUS

## 2023-10-10 NOTE — ED Notes (Signed)
 See triage notes. Pt is CAOx4, breathing normally, and normal in color. Pt reports that diarrhea started yesterday and has foul smell. Pt reports being on Augmentin since June. Pt's husband at bedside. Pt denies any needs at this time.

## 2023-10-10 NOTE — ED Provider Notes (Signed)
 [facility] Provider Note    Event Date/Time   First MD Initiated Contact with Patient 10/10/23 1112     (approximate)   History   Post-op Problem and sepsis workup  To ED for post op problem. Had appendicitis with perforation, then appendectomy at hospital in [facility] on 6/28. Had lactic acidosis and sepsis after this as well as ileus. Now having pain to R mid/lower back and flank pain. Finished abx yesterday. Started having diarrhea yesterday and woke up at 0500 this AM with pain. Pt concerned may have abscess because there was a fluid collection after surgery that was inaccessible for aspiration and they tried to treat with abx. Still has JP drain to LLQ.    HPI Yolanda Curtis is a 48 y.o. female PMH MDD/GAD, recent admission for acute perforated appendicitis outside hospital presents for evaluation of pain, diarrhea -Unfortunately recent hospitalization records not available at this time.  Reportedly had appendicitis with perforation at a hospital in [facility] on 09/17/2023.  Concern for sepsis after this as well as ileus.  Has a JP drain to left lower quadrant.  Antibiotics yesterday and started having diarrhea yesterday as well and woke at 5 AM today with pain. -Pain is currently 5/10, localizes to right flank and right lower quadrant.  Took Motrin around 5 AM.  Is having bowel movements.  No vomiting.  No fevers.  No frank urinary symptoms.  Does have remote history of urolithiasis. -Notes drain output recently diminished.     Physical Exam   Triage Vital Signs: ED Triage Vitals  Encounter Vitals Group     BP 10/10/23 1058 (!) 109/91     Girls Systolic BP Percentile --      Girls Diastolic BP Percentile --      Boys Systolic BP Percentile --      Boys Diastolic BP Percentile --      Pulse Rate 10/10/23 1058 (!) 107     Resp 10/10/23 1058 20     Temp 10/10/23 1058 98.7 F (37.1 C)     Temp Source 10/10/23 1058 Oral     SpO2 10/10/23 1058 94 %      Weight 10/10/23 1059 145 lb (65.8 kg)     Height 10/10/23 1059 5' 2 (1.575 m)     Head Circumference --      Peak Flow --      Pain Score 10/10/23 1055 5     Pain Loc --      Pain Education --      Exclude from Growth Chart --     Most recent vital signs: Vitals:   10/10/23 1130 10/10/23 1230  BP: 112/73 109/64  Pulse: 93 89  Resp: 18 14  Temp:    SpO2: 100% 100%     General: Awake, no distress.  CV:  Good peripheral perfusion.  Heart rate 90s at time of my eval, regular rhythm, RP 2+ Resp:  Normal effort. CTAB Abd:  No distention.  Well-healing surgical scars in the abdomen.  Mild tenderness in left lower quadrant, moderate tenderness in right lower quadrant and right flank.  JP drain in place in left lower quadrant, draining small amount of serous fluid. Other:     ED Results / Procedures / Treatments   Labs (all labs ordered are listed, but only abnormal results are displayed) Labs Reviewed  COMPREHENSIVE METABOLIC PANEL WITH GFR - Abnormal; Notable for the following components:      Result  Value   Creatinine, Ser 0.43 (*)    All other components within normal limits  LACTIC ACID, PLASMA - Abnormal; Notable for the following components:   Lactic Acid, Venous 3.0 (*)    All other components within normal limits  CBC WITH DIFFERENTIAL/PLATELET - Abnormal; Notable for the following components:   MCV 101.3 (*)    Platelets 535 (*)    All other components within normal limits  URINALYSIS, W/ REFLEX TO CULTURE (INFECTION SUSPECTED) - Abnormal; Notable for the following components:   Color, Urine YELLOW (*)    APPearance CLEAR (*)    Specific Gravity, Urine 1.040 (*)    Bacteria, UA RARE (*)    All other components within normal limits  CULTURE, BLOOD (ROUTINE X 2)  CULTURE, BLOOD (ROUTINE X 2)  LACTIC ACID, PLASMA  PROTIME-INR  HCG, QUANTITATIVE, PREGNANCY  POC URINE PREG, ED     EKG  N/a   RADIOLOGY Radiology interpreted myself and radiology report  reviewed.  No obvious acute pathology identified.    PROCEDURES:  Critical Care performed: No  Procedures   MEDICATIONS ORDERED IN ED: Medications  sodium chloride  0.9 % bolus 1,000 mL (1,000 mLs Intravenous New Bag/Given 10/10/23 1214)  iohexol  (OMNIPAQUE ) 300 MG/ML solution 80 mL (80 mLs Intravenous Contrast Given 10/10/23 1339)     IMPRESSION / MDM / ASSESSMENT AND PLAN / ED COURSE  I reviewed the triage vital signs and the nursing notes.                              DDX/MDM/AP: Differential diagnosis includes, but is not limited to, postsurgical complication including abscess, urolithiasis, diverticulitis.  Plan: - Labs - CT abdomen pelvis - Patient denies need for further pain control - N.p.o.  Patient's presentation is most consistent with acute presentation with potential threat to life or bodily function.  The patient is on the cardiac monitor to evaluate for evidence of arrhythmia and/or significant heart rate changes.  ED course below.  Initial labs with isolated elevated lactate, otherwise unremarkable.  Patient has had decreased p.o. intake recently, consider possible secondary to dehydration--fortunately no fever, hypotension, tachycardia at this time and no leukocytosis, will defer antibiotics at this time though low threshold to initiate.  IV fluid given.  Pete lactate downtrending.  CT unremarkable.  No evidence of acute pathology today, stable for outpatient follow-up tomorrow with general surgery.  Patient agrees with plan.  Has Tylenol and Motrin at home.  ED return precautions in place.  Clinical Course as of 10/10/23 1600  Sun Oct 10, 2023  1150 CBC, CMP reviewed, unremarkable  Lactate notably elevated at 3 [MM]  1438 CTAP: IMPRESSION: 1. No acute intra-abdominal process. 2. Appendix is not seen and hyperdense material is noted at the cecum with associated mesenteric fat stranding. Correlation with surgical history of is recommended. 3. Left-sided  abdominal drain terminates in the mid right abdomen. No residual abscess or fluid collection is seen.   [MM]  1446 Patient reevaluated, still with persistent pain but appears nontoxic appearing.  Has follow-up appointment with Dr. Desiderio of general surgery tomorrow.  Will discuss with on-call surgeon now regarding hyperdense material in the cecum with fat stranding. [MM]  1505 Case discussed with Dr. Tye of general surgery, CT imaging reviewed.  No concerning findings.  Does agree that hyperdense material is surgical staple line from appendix.  No concerning on CT in area of pain. [MM]  1551  Rpt lactate wnl UA wnl [MM]    Clinical Course User Index [MM] Clarine Ozell LABOR, MD     FINAL CLINICAL IMPRESSION(S) / ED DIAGNOSES   Final diagnoses:  Post-operative pain     Rx / DC Orders   ED Discharge Orders     None        Note:  This document was prepared using Dragon voice recognition software and may include unintentional dictation errors.   Clarine Ozell LABOR, MD 10/10/23 1600

## 2023-10-10 NOTE — ED Triage Notes (Signed)
 To ED for post op problem. Had appendicitis with perforation, then appendectomy at hospital in [facility] on 6/28. Had lactic acidosis and sepsis after this as well as ileus. Now having pain to R mid/lower back and flank pain. Finished abx yesterday. Started having diarrhea yesterday and woke up at 0500 this AM with pain. Pt concerned may have abscess because there was a fluid collection after surgery that was inaccessible for aspiration and they tried to treat with abx. Still has JP drain to LLQ.

## 2023-10-10 NOTE — ED Notes (Signed)
 Pt went for xray at this time

## 2023-10-10 NOTE — Discharge Instructions (Signed)
 Your evaluation in the emergency department was overall reassuring, and a CT showed no recurrent abscess in your abdomen.  I am unsure as to the exact cause of your discomfort, but we saw no concerning findings today.  You can use Tylenol and Motrin as needed for any ongoing pain.  Please do follow-up with the general surgery team tomorrow as already planned.  Return to the emergency department with any new or worsening symptoms.

## 2023-10-10 NOTE — ED Notes (Signed)
 Called CCMD and added pt to board.

## 2023-10-11 ENCOUNTER — Ambulatory Visit: Payer: Self-pay | Admitting: Surgery

## 2023-10-11 ENCOUNTER — Encounter: Payer: Self-pay | Admitting: Surgery

## 2023-10-11 VITALS — BP 113/73 | HR 91 | Temp 98.1°F | Ht 62.0 in | Wt 154.2 lb

## 2023-10-11 DIAGNOSIS — K3532 Acute appendicitis with perforation and localized peritonitis, without abscess: Secondary | ICD-10-CM | POA: Diagnosis not present

## 2023-10-11 NOTE — Patient Instructions (Signed)

## 2023-10-11 NOTE — Progress Notes (Signed)
 10/11/2023  Reason for Visit: Perforated appendicitis  History of Present Illness: Yolanda Curtis is a 48 y.o. female with a recent history of perforated appendicitis, status post laparoscopic appendectomy on outside hospital.  The patient was admitted to [facility] on 09/25/2023 with acute appendicitis.  She underwent a laparoscopic appendectomy on 09/26/2023 at which time she was noted to have perforated appendicitis.  A round JP drain was left in place.  She had postoperative ileus which required NG tube placement and eventually she was discharged home on 10/02/2023.  She was discharged with skin staples and drain in place.  The patient presented to our emergency room yesterday due to pain in the right flank with some shortness of breath associated with the pain.  She also started having diarrhea.  She had a CT scan of the abdomen pelvis which showed no residual abscess in the abdomen with some postoperative changes from inflammation, and a very small right pleural effusion.  Otherwise no specific complications to result in her pain.  Her lactic acid was elevated at first at 3.0 but this resolved with some IV fluids down to 1.6.  Her white blood cell count was normal at 6.1.  She was discharged home.  Past Medical History: Past Medical History:  Diagnosis Date   Acute perforated appendicitis 09/25/2023   Allergy    Anxiety    Chronic gastritis without bleeding 04/21/2019   Complication of anesthesia    Depression    GERD (gastroesophageal reflux disease)    Ileus following gastrointestinal surgery (HCC) 2025   had appendectomy 6/28, then ileus   PONV (postoperative nausea and vomiting)    Vaginal Pap smear, abnormal      Past Surgical History: Past Surgical History:  Procedure Laterality Date   APPENDECTOMY     AXILLARY HIDRADENITIS EXCISION     CESAREAN SECTION  x 2   COLONOSCOPY WITH PROPOFOL  N/A 08/23/2019   Procedure: COLONOSCOPY WITH PROPOFOL ;  Surgeon:  Unk Corinn Skiff, MD;  Location: [facility] ENDOSCOPY;  Service: Gastroenterology;  Laterality: N/A;   TUBAL LIGATION      Home Medications: Prior to Admission medications   Medication Sig Start Date End Date Taking? Authorizing Provider  Cholecalciferol (VITAMIN D3) 50 MCG (2000 UT) capsule Take by mouth.   Yes [provider]  loratadine (CLARITIN) 10 MG tablet Take 10 mg by mouth daily.   Yes [provider]  Magnesium 200 MG TABS  03/30/21  Yes [provider]  Multiple Vitamin (MULTIVITAMIN) tablet Take 1 tablet by mouth daily.   Yes [provider]  venlafaxine  XR (EFFEXOR -XR) 75 MG 24 hr capsule TAKE 1 CAPSULE BY MOUTH ONCE DAILY WITH BREAKFAST 05/31/20  Yes Flinchum, Rosaline RAMAN, FNP    Allergies: Allergies  Allergen Reactions   Penicillins Rash    Social History:  reports that she has quit smoking. She has never used smokeless tobacco. She reports current alcohol use of about 10.0 standard drinks of alcohol per week. She reports that she does not use drugs.   Family History: Family History  Problem Relation Age of Onset   Hypertension Father    Hypercholesterolemia Father    Stroke Father    Hypercholesterolemia Mother    Depression Mother    Hyperlipidemia Mother    Breast cancer Maternal Aunt 18   Cancer - Other Maternal Aunt     Review of Systems: Review of Systems  Constitutional:  Negative for chills and fever.  Respiratory:  Positive for  shortness of breath.   Cardiovascular:  Negative for chest pain.  Gastrointestinal:  Positive for abdominal pain. Negative for nausea and vomiting.    Physical Exam BP 113/73   Pulse 91   Temp 98.1 F (36.7 C) (Oral)   Ht 5' 2 (1.575 m)   Wt 154 lb 3.2 oz (69.9 kg)   LMP 09/27/2023 (Approximate)   SpO2 97%   BMI 28.20 kg/m  CONSTITUTIONAL: No acute distress, well-nourished HEENT:  Normocephalic, atraumatic, extraocular motion intact. RESPIRATORY:  Normal respiratory effort without  pathologic use of accessory muscles. CARDIOVASCULAR: Regular rhythm and rate GI: The abdomen is soft, nondistended, appropriately tender to palpation.  Left-sided incisions are healing well and are clean, dry, intact with staples in place with some irritation at the drain insertion site itself.  Drain has serous fluid.  Drain was removed at bedside without complications and dry gauze dressing applied.  Staples removed and changed to Steri-Strips.   MUSCULOSKELETAL:  Normal muscle strength and tone in all four extremities.  No peripheral edema or cyanosis. NEUROLOGIC:  Motor and sensation is grossly normal.  Cranial nerves are grossly intact. PSYCH:  Alert and oriented to person, place and time. Affect is normal.  Laboratory Analysis: Results for orders placed or performed during the hospital encounter of 10/10/23 (from the past 24 hours)  Lactic acid, plasma     Status: None   Collection Time: 10/10/23  3:08 PM  Result Value Ref Range   Lactic Acid, Venous 1.6 0.5 - 1.9 mmol/L  Urinalysis, w/ Reflex to Culture (Infection Suspected) -Urine, Clean Catch     Status: Abnormal   Collection Time: 10/10/23  3:08 PM  Result Value Ref Range   Specimen Source URINE, CATHETERIZED    Color, Urine YELLOW (A) YELLOW   APPearance CLEAR (A) CLEAR   Specific Gravity, Urine 1.040 (H) 1.005 - 1.030   pH 6.0 5.0 - 8.0   Glucose, UA NEGATIVE NEGATIVE mg/dL   Hgb urine dipstick NEGATIVE NEGATIVE   Bilirubin Urine NEGATIVE NEGATIVE   Ketones, ur NEGATIVE NEGATIVE mg/dL   Protein, ur NEGATIVE NEGATIVE mg/dL   Nitrite NEGATIVE NEGATIVE   Leukocytes,Ua NEGATIVE NEGATIVE   RBC / HPF 0-5 0 - 5 RBC/hpf   WBC, UA 0-5 0 - 5 WBC/hpf   Bacteria, UA RARE (A) NONE SEEN   Squamous Epithelial / HPF 0-5 0 - 5 /HPF   Mucus PRESENT     Imaging: CT ABDOMEN PELVIS W CONTRAST Result Date: 10/10/2023 CLINICAL DATA:  Recent admission to outside hospital for perforated appendicitis. Recurrent right lower quadrant abdominal  pain. EXAM: CT ABDOMEN AND PELVIS WITH CONTRAST TECHNIQUE: Multidetector CT imaging of the abdomen and pelvis was performed using the standard protocol following bolus administration of intravenous contrast. RADIATION DOSE REDUCTION: This exam was performed according to the departmental dose-optimization program which includes automated exposure control, adjustment of the mA and/or kV according to patient size and/or use of iterative reconstruction technique. CONTRAST:  80mL OMNIPAQUE  IOHEXOL  300 MG/ML  SOLN COMPARISON:  None Available. FINDINGS: Lower chest: There is a small right pleural effusion and trace left pleural effusion with atelectasis at the lung bases bilaterally. Hepatobiliary: No focal liver abnormality is seen. No gallstones, gallbladder wall thickening, or biliary dilatation. Pancreas: Unremarkable. No pancreatic ductal dilatation or surrounding inflammatory changes. Spleen: Normal in size without focal abnormality. Adrenals/Urinary Tract: The adrenal glands are within normal limits. The kidneys enhance symmetrically. No renal calculus or hydronephrosis bilaterally. The bladder is unremarkable. Stomach/Bowel: The stomach is  within normal limits. No bowel obstruction, free air, or pneumatosis is seen. The distal tip of a left lower quadrant drain terminates in the right lower quadrant. No residual fluid collection or abscess is seen. A few scattered diverticular present along the colon without evidence of diverticulitis. The appendix is not clearly delineated on exam and hyperdense material is noted at the cecum with associated fat stranding in the mesentery. Vascular/Lymphatic: No significant vascular findings are present. No enlarged abdominal or pelvic lymph nodes. Reproductive: Uterus and bilateral adnexa are unremarkable. Other: No abdominopelvic ascites. There is a small fat containing umbilical hernia. Surgical staples and subcutaneous fat stranding are noted in the left upper quadrant.  Musculoskeletal: No acute osseous abnormality. IMPRESSION: 1. No acute intra-abdominal process. 2. Appendix is not seen and hyperdense material is noted at the cecum with associated mesenteric fat stranding. Correlation with surgical history of is recommended. 3. Left-sided abdominal drain terminates in the mid right abdomen. No residual abscess or fluid collection is seen. Electronically Signed   By: Leita Birmingham M.D.   On: 10/10/2023 14:33    Assessment and Plan: This is a 48 y.o. female status post laparoscopic appendectomy for perforated appendicitis at outside hospital.  - The patient has completed her antibiotic course and reports that her bowels are moving a little bit slowly up until yesterday when she started having diarrhea.  I discussed with the patient that potentially diarrhea could be as a result of the antibiotic that she was on.  She could start taking probiotics to help with this.  However if no improvement, or if the diarrhea is getting worse and having more abdominal pain, she should let us  know as she might need to be tested for C. difficile. - Looking at the CT scan from yesterday, her drain was going all the way to the right upper quadrant and this could potentially be also the source of her pain.  The small pleural effusion on the right side could be the cause of some of her shortness of breath with deep inspiration.  The drain was removed today without any complications and the pleural effusion should be able to resolve on its own as it is very small. - Recommended that she continue a soft diet for the next few days while her bowels are resetting after this postoperative ileus and inflammatory response from her perforated appendicitis. - Patient may follow-up as needed.  I spent 30 minutes dedicated to the care of this patient on the date of this encounter to include pre-visit review of records, face-to-face time with the patient discussing diagnosis and management, and any  post-visit coordination of care.   Aloysius Sheree Plant, MD [facility]

## 2023-10-13 ENCOUNTER — Encounter: Payer: Self-pay | Admitting: Surgery

## 2023-10-15 ENCOUNTER — Emergency Department
Admission: EM | Admit: 2023-10-15 | Discharge: 2023-10-15 | Disposition: A | Discharge status: Home / Self Care | Attending: Emergency Medicine | Admitting: Emergency Medicine

## 2023-10-15 ENCOUNTER — Other Ambulatory Visit: Payer: Self-pay

## 2023-10-15 DIAGNOSIS — R1031 Right lower quadrant pain: Secondary | ICD-10-CM | POA: Diagnosis present

## 2023-10-15 LAB — COMPREHENSIVE METABOLIC PANEL WITH GFR
ALT: 14 U/L (ref 0–44)
AST: 17 U/L (ref 15–41)
Albumin: 3.7 g/dL (ref 3.5–5.0)
Alkaline Phosphatase: 53 U/L (ref 38–126)
Anion gap: 10 (ref 5–15)
BUN: 9 mg/dL (ref 6–20)
CO2: 23 mmol/L (ref 22–32)
Calcium: 9 mg/dL (ref 8.9–10.3)
Chloride: 104 mmol/L (ref 98–111)
Creatinine, Ser: 0.62 mg/dL (ref 0.44–1.00)
GFR, Estimated: >60 mL/min (ref >60)
Glucose, Bld: 112 mg/dL — ABNORMAL HIGH (ref 70–99)
Potassium: 3.9 mmol/L (ref 3.5–5.1)
Sodium: 137 mmol/L (ref 135–145)
Total Bilirubin: 0.5 mg/dL (ref 0.0–1.2)
Total Protein: 7.3 g/dL (ref 6.5–8.1)

## 2023-10-15 LAB — URINALYSIS, COMPLETE (UACMP) WITH MICROSCOPIC
Bilirubin Urine: NEGATIVE
Glucose, UA: NEGATIVE mg/dL
Hgb urine dipstick: NEGATIVE
Ketones, ur: NEGATIVE mg/dL
Leukocytes,Ua: NEGATIVE
Nitrite: NEGATIVE
Protein, ur: NEGATIVE mg/dL
Specific Gravity, Urine: 1.017 (ref 1.005–1.030)
pH: 5 (ref 5.0–8.0)

## 2023-10-15 LAB — CULTURE, BLOOD (ROUTINE X 2)
Culture: NO GROWTH
Culture: NO GROWTH
Special Requests: ADEQUATE

## 2023-10-15 LAB — CBC
HCT: 35.8 % — ABNORMAL LOW (ref 36.0–46.0)
Hemoglobin: 12.1 g/dL (ref 12.0–15.0)
MCH: 33.3 pg (ref 26.0–34.0)
MCHC: 33.8 g/dL (ref 30.0–36.0)
MCV: 98.6 fL (ref 80.0–100.0)
Platelets: 383 K/uL (ref 150–400)
RBC: 3.63 MIL/uL — ABNORMAL LOW (ref 3.87–5.11)
RDW: 12.5 % (ref 11.5–15.5)
WBC: 10 K/uL (ref 4.0–10.5)
nRBC: 0 % (ref 0.0–0.2)

## 2023-10-15 LAB — LIPASE, BLOOD: Lipase: 25 U/L (ref 11–51)

## 2023-10-15 MED ORDER — SODIUM CHLORIDE 0.9 % IV SOLN
12.5000 mg | Freq: Once | INTRAVENOUS | Status: AC
  Administered 2023-10-15: 12.5 mg via INTRAVENOUS
  Filled 2023-10-15: qty 12.5

## 2023-10-15 MED ORDER — ONDANSETRON HCL 4 MG/2ML IJ SOLN
4.0000 mg | Freq: Once | INTRAMUSCULAR | Status: AC
  Administered 2023-10-15: 4 mg via INTRAVENOUS
  Filled 2023-10-15: qty 2

## 2023-10-15 MED ORDER — GABAPENTIN 300 MG PO CAPS: 300.0000 mg | ORAL_CAPSULE | Freq: Three times a day (TID) | ORAL | 0 refills | Status: AC

## 2023-10-15 MED ORDER — SODIUM CHLORIDE 0.9 % IV BOLUS
1000.0000 mL | Freq: Once | INTRAVENOUS | Status: AC
  Administered 2023-10-15: 1000 mL via INTRAVENOUS

## 2023-10-15 MED ORDER — HYDROMORPHONE HCL 1 MG/ML IJ SOLN: 0.5000 mg | Freq: Once | INTRAMUSCULAR | Status: DC

## 2023-10-15 MED ORDER — GABAPENTIN 300 MG PO CAPS
300.0000 mg | ORAL_CAPSULE | Freq: Once | ORAL | Status: AC
  Administered 2023-10-15: 300 mg via ORAL
  Filled 2023-10-15: qty 1

## 2023-10-15 MED ORDER — OXYCODONE-ACETAMINOPHEN 5-325 MG PO TABS
1.0000 | ORAL_TABLET | Freq: Once | ORAL | Status: AC
  Administered 2023-10-15: 1 via ORAL
  Filled 2023-10-15: qty 1

## 2023-10-15 MED ORDER — PROMETHAZINE HCL 12.5 MG PO TABS: 12.5000 mg | ORAL_TABLET | Freq: Four times a day (QID) | ORAL | 0 refills | Status: DC | PRN

## 2023-10-15 MED ORDER — OXYCODONE-ACETAMINOPHEN 5-325 MG PO TABS: 1.0000 | ORAL_TABLET | ORAL | 0 refills | Status: DC | PRN

## 2023-10-15 MED ORDER — ONDANSETRON 4 MG PO TBDP: 4.0000 mg | ORAL_TABLET | Freq: Three times a day (TID) | ORAL | 0 refills | Status: DC | PRN

## 2023-10-15 MED ORDER — HYDROMORPHONE HCL 1 MG/ML IJ SOLN
0.5000 mg | INTRAMUSCULAR | Status: DC | PRN
  Administered 2023-10-15: 0.5 mg via INTRAVENOUS
  Filled 2023-10-15: qty 0.5

## 2023-10-15 NOTE — ED Provider Notes (Signed)
 [facility] Provider Note    Event Date/Time   First MD Initiated Contact with Patient 10/15/23 [phone]     (approximate)  History   Chief Complaint: Abdominal Pain  HPI  Makenli Derstine is a 48 y.o. female with a past medical history of anxiety, gastric reflux, appendectomy 09/25/2023, complicated by postoperative abscess and JP drain currently being managed by Dr. Desiderio, JP drain was removed on Monday (4 days ago) patient has had worsening pain.  She has had multiple CT scans over the past week including 3 days ago at [facility] for the same pain.  Patient describes the pain in her right flank/right lower back, worse with any type of movement.  Patient has been prescribed tramadol to use it at home as well as cefpodoxime to cover for any possible underlying infection.  CT scan from 3 days ago did show a fluid collection in the left lower quadrant.  Patient also had a CTA of the chest that showed no PE as well as a pelvic ultrasound showing no significant finding.  Lab work was nonrevealing at [facility].  Physical Exam   Triage Vital Signs: ED Triage Vitals  Encounter Vitals Group     BP 10/15/23 0945 107/79     Girls Systolic BP Percentile --      Girls Diastolic BP Percentile --      Boys Systolic BP Percentile --      Boys Diastolic BP Percentile --      Pulse Rate 10/15/23 0945 (!) 115     Resp 10/15/23 0945 17     Temp 10/15/23 0945 98.2 F (36.8 C)     Temp Source 10/15/23 0945 Oral     SpO2 10/15/23 0945 99 %     Weight 10/15/23 0943 154 lb 3.2 oz (69.9 kg)     Height --      Head Circumference --      Peak Flow --      Pain Score 10/15/23 0943 2     Pain Loc --      Pain Education --      Exclude from Growth Chart --     Most recent vital signs: Vitals:   10/15/23 0945 10/15/23 0945  BP:  107/79  Pulse:  (!) 115  Resp:  17  Temp: 98.2 F (36.8 C) 98 F (36.7 C)  SpO2:  99%    General: Awake, no distress.  CV:  Good peripheral perfusion.  Regular  rate and rhythm  Resp:  Normal effort.  Equal breath sounds bilaterally.  Abd:  No distention.  Soft, nontender.  No rebound or guarding.  Benign abdomen.  Well-appearing drain incision (which has been removed) in the left lower quadrant no signs of infection.  ED Results / Procedures / Treatments   EKG  EKG viewed and interpreted by myself shows sinus tachycardia at 118 bpm with a narrow QRS, normal axis, normal intervals, no concerning ST changes.  MEDICATIONS ORDERED IN ED: Medications - No data to display   IMPRESSION / MDM / ASSESSMENT AND PLAN / ED COURSE  I reviewed the triage vital signs and the nursing notes.  Patient's presentation is most consistent with acute presentation with potential threat to life or bodily function.  Patient presents to the emergency department for right flank pain/back pain which has been ongoing for quite some time since the patient's surgery 20 days ago but has been worsening over the last few days since her JP drain  was removed.  Patient did have CT imaging at [facility] 3 days ago showing a small fluid collection of the left lower quadrant no PE, pelvic ultrasound negative.  Lab work at that time was normal we will recheck labs today to evaluate for any possible leukocytosis.  Patient is on oral antibiotics started 3 days ago as well as tramadol for pain control as the patient states she cannot tolerate anything stronger at home.  We will treat pain, hydrate check labs and continue to closely monitor.  We will likely discuss with surgery once her results are known.  Patient's lab work is reassuring with a reassuring CBC with a normal white blood cell count, reassuring chemistry, normal lipase.  I spoke with Dr. Desiderio who has been following the patient.  He has reviewed the 2 recent CT images including [facility] as well as our recent CT image.  He does not believe that there is any surgical reason for the patient's pain beyond postoperative pain/inflammation.  Patient  is only taking tramadol at home due to nausea.  Will attempt Percocet in the emergency department see if the patient can tolerate a stronger pain medication.  Patient is on an antibiotic as a precaution already.  We will send a urinalysis just in case although patient had a recent urinalysis at [facility] that was negative.  Patient is frustrated that the workup continues to show no significant findings.  However given the patient's recent CTA of the chest, CT scan with contrast of the abdomen and pelvis as well as pelvic ultrasound showing no significant finding, I do not believe there are any further tests to offer the patient at this time to explain the patient's pain.  We will attempt p.o. medications in the emergency department if the patient can tolerate we will discharge on the same.  Patient's description of the pain sounds very neuropathic, given the pleuritic nature with a negative CTA this very well could be neuropathic type pain.  Will try gabapentin as well as Percocet to see if this helps with the patient's discomfort.  Urinalysis does show many bacteria however no white cells and the patient is currently taking cefpodoxime.  Patient had a urine culture from 3 days ago at [facility] that was negative.  We will hold off on changing the antibiotic but we will add a urine culture to today's urinalysis.  FINAL CLINICAL IMPRESSION(S) / ED DIAGNOSES   Right flank pain  Note:  This document was prepared using Dragon voice recognition software and may include unintentional dictation errors.   Dorothyann Drivers, MD 10/15/23 [phone]

## 2023-10-15 NOTE — ED Triage Notes (Addendum)
 PT arrives via EMS  for right abd/ flank pain. Pt had an appendectomy on 6/28 in [place] and then became septic and had an ileus. Pt today has been having the continued pain. EMS placed a 22g in the left hand with 125mcg of fentanyl.

## 2023-10-15 NOTE — Discharge Instructions (Addendum)
 Please take your pain medication as needed, as prescribed.  Please take gabapentin 3 times daily starting this evening.  Please take Zofran  or Phenergan (1 or the other) every 8 hours as needed for nausea.  Please follow-up with Dr. Desiderio.  Return to the emergency department for any worsening pain or any other symptom personally concerning to yourself.

## 2023-10-16 LAB — URINE CULTURE: Culture: <10000 — AB

## 2023-10-22 ENCOUNTER — Ambulatory Visit: Admitting: Surgery

## 2023-10-22 ENCOUNTER — Encounter: Payer: Self-pay | Admitting: Surgery

## 2023-10-22 VITALS — BP 109/75 | HR 92 | Ht 62.0 in | Wt 148.0 lb

## 2023-10-22 DIAGNOSIS — K3532 Acute appendicitis with perforation and localized peritonitis, without abscess: Secondary | ICD-10-CM

## 2023-10-22 NOTE — Patient Instructions (Addendum)
 Do recommend you take an other the counter probiotic. Also may try eating yogurt with live cultures. May also take fiber supplement to help bulk up your stools.  If you do not have any improvement with this in the next few days do call us  and we will send you for some stool and lab testing.   Follow-up with our office as needed.  Please call and ask to speak with a nurse if you develop questions or concerns.   GENERAL POST-OPERATIVE PATIENT INSTRUCTIONS   WOUND CARE INSTRUCTIONS:  Try to keep the wound dry and avoid ointments on the wound unless directed to do so.  If the wound becomes bright red and painful or starts to drain infected material that is not clear, please contact your physician immediately.  If the wound is mildly pink and has a thick firm ridge underneath it, this is normal, and is referred to as a healing ridge.  This will resolve over the next 4-6 weeks.  BATHING: You may shower if you have been informed of this by your surgeon. However, Please do not submerge in a tub, hot tub, or pool until incisions are completely sealed or have been told by your surgeon that you may do so.  DIET:  You may eat any foods that you can tolerate.  It is a good idea to eat a high fiber diet and take in plenty of fluids to prevent constipation.  If you do become constipated you may want to take a mild laxative or take ducolax tablets on a daily basis until your bowel habits are regular.  Constipation can be very uncomfortable, along with straining, after recent surgery.  ACTIVITY:  You are encouraged to walk and engage in light activity for the next two weeks.  You should not lift more than 20 pounds for 6 weeks total after surgery as it could put you at increased risk for complications.  Twenty pounds is roughly equivalent to a plastic bag of groceries. At that time- Listen to your body when lifting, if you have pain when lifting, stop and then try again in a few days. Soreness after doing  exercises or activities of daily living is normal as you get back in to your normal routine.  MEDICATIONS:  Try to take narcotic medications and anti-inflammatory medications, such as tylenol , ibuprofen, naprosyn, etc., with food.  This will minimize stomach upset from the medication.  Should you develop nausea and vomiting from the pain medication, or develop a rash, please discontinue the medication and contact your physician.  You should not drive, make important decisions, or operate machinery when taking narcotic pain medication.  SUNBLOCK Use sun block to incision area over the next year if this area will be exposed to sun. This helps decrease scarring and will allow you avoid a permanent darkened area over your incision.  QUESTIONS:  Please feel free to call our office if you have any questions, and we will be glad to assist you. [phone]

## 2023-10-22 NOTE — Progress Notes (Signed)
 10/22/2023  History of Present Illness: Yolanda Curtis is a 48 y.o. female s/p laparoscopic appendectomy on 09/25/23 for perforated appendicitis at [facility].  She had post-operative ileus which required NG tube.  She was discharged on 10/02/23 with a drain and staples.  Drain and staples were removed on 10/11/23 in our office.  However, she started experiencing significant right flank pain on 7/13 and she has presented to the ER on 7/13, 7/15, and 7/18.  She had two CT scans of the abdomen/pelvis and a transvaginal ultrasound which have not found any significant pathology to account for the pain in her right flank.  She was started on Percocet, Gabapentin  and Phenergan  on 7/18 and she was also given a prescription for Cefpodoxime on 7/15 for a suspected UTI.    The patient reports that 3 days ago her pain significantly improved.  She has not needed the narcotic and is only on Gabapentin .  She has been experiencing diarrhea after she completed the antibiotic course, and has had multiple episodes of diarrhea over the past few days.  She also had an episode of emesis.  Past Medical History: Past Medical History:  Diagnosis Date   Acute perforated appendicitis 09/25/2023   Allergy    Anxiety    Chronic gastritis without bleeding 04/21/2019   Complication of anesthesia    Depression    GERD (gastroesophageal reflux disease)    Ileus following gastrointestinal surgery (HCC) 2025   had appendectomy 6/28, then ileus   PONV (postoperative nausea and vomiting)    Vaginal Pap smear, abnormal      Past Surgical History: Past Surgical History:  Procedure Laterality Date   APPENDECTOMY     AXILLARY HIDRADENITIS EXCISION     CESAREAN SECTION  x 2   COLONOSCOPY WITH PROPOFOL  N/A 08/23/2019   Procedure: COLONOSCOPY WITH PROPOFOL ;  Surgeon: Unk Corinn Skiff, MD;  Location: [facility] ENDOSCOPY;  Service: Gastroenterology;  Laterality: N/A;   TUBAL LIGATION      Home  Medications: Prior to Admission medications   Medication Sig Start Date End Date Taking? Authorizing Provider  Cholecalciferol (VITAMIN D3) 50 MCG (2000 UT) capsule Take by mouth.    [provider]  gabapentin  (NEURONTIN ) 300 MG capsule Take 1 capsule (300 mg total) by mouth 3 (three) times daily. 10/15/23 11/14/23  Paduchowski, Kevin, MD  loratadine (CLARITIN) 10 MG tablet Take 10 mg by mouth daily.    [provider]  Magnesium 200 MG TABS  03/30/21   [provider]  Multiple Vitamin (MULTIVITAMIN) tablet Take 1 tablet by mouth daily.    [provider]  ondansetron  (ZOFRAN -ODT) 4 MG disintegrating tablet Take 1 tablet (4 mg total) by mouth every 8 (eight) hours as needed for nausea or vomiting. 10/15/23   Dorothyann Drivers, MD  oxyCODONE -acetaminophen  (PERCOCET) 5-325 MG tablet Take 1 tablet by mouth every 4 (four) hours as needed for severe pain (pain score 7-10). 10/15/23   Dorothyann Drivers, MD  promethazine  (PHENERGAN ) 12.5 MG tablet Take 1 tablet (12.5 mg total) by mouth every 6 (six) hours as needed for nausea or vomiting. 10/15/23   Dorothyann Drivers, MD  venlafaxine  XR (EFFEXOR -XR) 75 MG 24 hr capsule TAKE 1 CAPSULE BY MOUTH ONCE DAILY WITH BREAKFAST 05/31/20   Flinchum, Rosaline RAMAN, FNP    Allergies: Allergies  Allergen Reactions   Penicillins Rash    Review of Systems: Review of Systems  Constitutional:  Negative for chills and fever.  Respiratory:  Negative for shortness  of breath.   Cardiovascular:  Negative for chest pain.  Gastrointestinal:  Positive for abdominal pain, diarrhea, nausea and vomiting.    Physical Exam BP 109/75   Pulse 92   Ht 5' 2 (1.575 m)   Wt 148 lb (67.1 kg)   LMP 09/27/2023 (Approximate)   SpO2 98%   BMI 27.07 kg/m  CONSTITUTIONAL: No acute distress HEENT:  Normocephalic, atraumatic, extraocular motion intact. RESPIRATORY:  Normal respiratory effort without pathologic use of accessory  muscles. CARDIOVASCULAR: Regula rhythm and rate. GI: The abdomen is soft, non-distended, with improved tenderness.  Incisions healing well.  Drain site had small suture that was removed by the patient while removing BandAid.  No erythema, induration noted. NEUROLOGIC:  Motor and sensation is grossly normal.  Cranial nerves are grossly intact. PSYCH:  Alert and oriented to person, place and time. Affect is normal.  Labs/Imaging: CT abdomen/pelvis on 10/12/23: FINDINGS:   LOWER CHEST: Please see dedicated CT chest for characterization of findings above the diaphragm.   LIVER: Normal liver contour.  No focal liver lesions.   BILIARY: The gallbladder is normal in appearance. No biliary ductal dilatation.    SPLEEN: Normal in size and contour.   PANCREAS: Normal pancreatic contour.  No focal lesions.  No ductal dilation.   ADRENAL GLANDS: Normal appearance of the adrenal glands.   KIDNEYS/URETERS: Symmetric renal enhancement. No hydronephrosis, No solid renal mass.   BLADDER: Unremarkable.   REPRODUCTIVE ORGANS: Hyperemic and thickened uterine lining which could be normal depending on patient's menopausal status. Bilateral ovarian cysts measuring up to 2.5 x 1.7 cm (2:94).   GI TRACT: Oral contrast within the small bowel reaching the level of terminal ileum.  No inflammatory changes are identified.  No discrete obstruction or transition point identified.  The appendix is surgically absent.   PERITONEUM, RETROPERITONEUM AND MESENTERY: Mesenteric stranding beginning in the appendectomy bed (4:36 appears to extend towards the anterior abdominal wall to a fluid collection in the left lower quadrant likely secondary to recently removed intra-abdominal drain (4:27) no free air.  No ascites.  No fluid collection.   LYMPH NODES: No adenopathy.   VESSELS: Hepatic and portal veins are patent.  Normal caliber aorta.    BONES and SOFT TISSUES: No aggressive osseous lesions. Small fat-containing  umbilical hernia. Left lower quadrant body wall stranding with walled off fluid collection that could represent postoperative seroma or abscess.    IMPRESSION:  Left lower quadrant dominant wall fluid collection could represent postoperative an abscess.    Transvaginal U/S on 10/12/23: IMPRESSION:  1. Left ovary was not visualized transabdominally or transvaginally. Visualized right ovary is unremarkable in appearance with appropriate arterial and venous flow.   2.  Postsurgical changes of cesarean section with fluid in the cervical canal and adjacent to the uterus.   3.  No definite intra-abdominal abscess or findings to explain patient's right-sided/lower back pain.    CT abdomen/pelvis on 10/10/23: IMPRESSION: 1. No acute intra-abdominal process. 2. Appendix is not seen and hyperdense material is noted at the cecum with associated mesenteric fat stranding. Correlation with surgical history of is recommended. 3. Left-sided abdominal drain terminates in the mid right abdomen. No residual abscess or fluid collection is seen.   Assessment and Plan: This is a 48 y.o. female s/p laparoscopic appendectomy  --Discussed with the patient that on personal viewing of the images done at [facility] and [facility], there is no abnormality noted in the right abdomen or flank area to explain the patient's pain.  No abscess, perforation noted.  Likely the pain was a result of inflammatory changes, but nothing that would require subsequent surgery or exploration.   --The drain site had a small suture that fell off, but otherwise no evidence of any abscess.  Likely the residual fluid noted on prior CT scan is related to the drain being pulled.  This should reabsorb on its own but gave the patient return precautions from that standpoint. --The patient is also having diarrhea that could be a result of the antibiotics she was on for presumed UTI.  Recommended that she take probiotics and also fiber supplementation to help  with the diarrhea.  If there is no improvement, she should let us  know so we can test for C-diff. --Otherwise follow up as needed.  I spent 20 minutes dedicated to the care of this patient on the date of this encounter to include pre-visit review of records, face-to-face time with the patient discussing diagnosis and management, and any post-visit coordination of care.   Aloysius Sheree Plant, MD [facility]
